# Patient Record
Sex: Female | Born: 1965
Health system: Southern US, Community
[De-identification: ages and names within clinical notes are randomized; demographics above are authoritative.]

## PROBLEM LIST (undated history)

## (undated) DIAGNOSIS — E7800 Pure hypercholesterolemia, unspecified: Secondary | ICD-10-CM

## (undated) DIAGNOSIS — E119 Type 2 diabetes mellitus without complications: Secondary | ICD-10-CM

## (undated) DIAGNOSIS — G43909 Migraine, unspecified, not intractable, without status migrainosus: Secondary | ICD-10-CM

## (undated) DIAGNOSIS — M62838 Other muscle spasm: Secondary | ICD-10-CM

## (undated) HISTORY — PX: MYOMECTOMY: SHX85

## (undated) HISTORY — PX: ABDOMINAL HYSTERECTOMY: SHX81

## (undated) HISTORY — PX: UTERINE FIBROID SURGERY: SHX826

## (undated) HISTORY — DX: Other muscle spasm: M62.838

## (undated) HISTORY — DX: Migraine, unspecified, not intractable, without status migrainosus: G43.909

---

## 2015-08-30 DIAGNOSIS — R0602 Shortness of breath: Secondary | ICD-10-CM | POA: Diagnosis not present

## 2015-08-30 DIAGNOSIS — R079 Chest pain, unspecified: Secondary | ICD-10-CM | POA: Diagnosis not present

## 2015-08-30 DIAGNOSIS — R0789 Other chest pain: Secondary | ICD-10-CM | POA: Diagnosis not present

## 2015-08-30 DIAGNOSIS — R42 Dizziness and giddiness: Secondary | ICD-10-CM | POA: Diagnosis not present

## 2015-08-31 DIAGNOSIS — R079 Chest pain, unspecified: Secondary | ICD-10-CM | POA: Diagnosis not present

## 2015-09-04 ENCOUNTER — Emergency Department (HOSPITAL_COMMUNITY)
Admission: EM | Admit: 2015-09-04 | Discharge: 2015-09-04 | Disposition: A | Discharge status: Home / Self Care | Payer: BLUE CROSS/BLUE SHIELD | Attending: Emergency Medicine | Admitting: Emergency Medicine

## 2015-09-04 ENCOUNTER — Encounter (HOSPITAL_COMMUNITY): Payer: Self-pay | Admitting: Emergency Medicine

## 2015-09-04 DIAGNOSIS — M62838 Other muscle spasm: Secondary | ICD-10-CM | POA: Diagnosis not present

## 2015-09-04 DIAGNOSIS — M542 Cervicalgia: Secondary | ICD-10-CM | POA: Diagnosis present

## 2015-09-04 DIAGNOSIS — E119 Type 2 diabetes mellitus without complications: Secondary | ICD-10-CM | POA: Insufficient documentation

## 2015-09-04 HISTORY — DX: Pure hypercholesterolemia, unspecified: E78.00

## 2015-09-04 HISTORY — DX: Type 2 diabetes mellitus without complications: E11.9

## 2015-09-04 MED ORDER — DIAZEPAM 2 MG PO TABS: 2.0000 mg | ORAL_TABLET | Freq: Once | ORAL | Status: DC

## 2015-09-04 MED ORDER — CYCLOBENZAPRINE HCL 10 MG PO TABS: 10.0000 mg | ORAL_TABLET | Freq: Two times a day (BID) | ORAL | 0 refills | Status: DC | PRN

## 2015-09-04 MED ORDER — DIAZEPAM 5 MG PO TABS
5.0000 mg | ORAL_TABLET | Freq: Once | ORAL | Status: AC
  Administered 2015-09-04: 5 mg via ORAL
  Filled 2015-09-04: qty 1

## 2015-09-04 MED ORDER — DIAZEPAM 5 MG PO TABS: 5.0000 mg | ORAL_TABLET | Freq: Two times a day (BID) | ORAL | 0 refills | Status: DC

## 2015-09-04 NOTE — ED Provider Notes (Signed)
[facility]-EMERGENCY DEPT Provider Note   CSN: [id number] Arrival date & time: 09/04/15  1236  By signing my name below, I, [name], attest that this documentation has been prepared under the direction and in the presence of PA. Electronically signed by: [name], ED Scribe. 09/04/15. 1:31 PM.   History   Chief Complaint Chief Complaint  Patient presents with  . Neck Pain  . Back Pain     The history is provided by the patient. No language interpreter was used.   HPI Comments:  Anna [name] is a 50 y.o. female who presents to the Emergency Department complaining of muscle spasms, which started 7 AM this morning. Spasms localized to right neck, shoulder and back. Symptoms came on suddenly. Associated symptoms include weakness and intermittent numbness in right arm. Pt has taken Tylenol, with no relief. Denies chest pain, SOB, numbness, weakness or tingling in lower extremities or left arm. No recent injuries.   Pe:   Past Medical History:  Diagnosis Date  . Diabetes mellitus without complication (HCC)   . Hypercholesteremia     There are no active problems to display for this patient.   History reviewed. No pertinent surgical history.  OB History    No data available       Home Medications    Prior to Admission medications   Medication Sig Start Date End Date Taking? Authorizing Provider  cyclobenzaprine (FLEXERIL) 10 MG tablet Take 1 tablet (10 mg total) by mouth 2 (two) times daily as needed for muscle spasms. 09/04/15   Eyvonne MechanicJeffrey Jeorge Reister, PA-C  diazepam (VALIUM) 5 MG tablet Take 1 tablet (5 mg total) by mouth 2 (two) times daily. 09/04/15   Eyvonne MechanicJeffrey Calee Nugent, PA-C    Family History History reviewed. No pertinent family history.  Social History Social History  Substance Use Topics  . Smoking status: Never Smoker  . Smokeless tobacco: Never Used  . Alcohol use No     Allergies   Gadolinium derivatives   Review of Systems Review of Systems  Respiratory:  Negative for shortness of breath.   Cardiovascular: Negative for chest pain.  Musculoskeletal: Positive for arthralgias, myalgias and neck pain.  Neurological: Positive for numbness. Negative for weakness.       Weakness and intermittent numbness in right arm due to tensing and tightening of muscles.  No weakness, numbness or tingling in left arm or lower extremities.  All other systems reviewed and are negative.    Physical Exam Updated Vital Signs BP 136/88   Pulse 75   Temp 98.2 F (36.8 C) (Oral)   Resp 16   SpO2 99%   Physical Exam  Constitutional: She is oriented to person, place, and time. She appears well-developed and well-nourished. No distress.  HENT:  Head: Normocephalic and atraumatic.  Eyes: Conjunctivae are normal. Right eye exhibits no discharge. Left eye exhibits no discharge. No scleral icterus.  Neck: Normal range of motion. Neck supple.  Cardiovascular: Normal rate.   Pulmonary/Chest: Effort normal.  Musculoskeletal: Normal range of motion. She exhibits tenderness. She exhibits no edema.  Obvious muscle spasm to right posterior neck musculature and upper trapezius. Full active ROM of right upper extremity. Sensations intact, grip strength 5/5. Radial pulse 2+.  No C, T, or L spine tenderness to palpation. No obvious signs of trauma, deformity, infection, step-offs. Lung expansion normal. No scoliosis or kyphosis. Bilateral lower extremity strength 5 out of 5, sensation grossly intact.   Ambulates without difficulty.   Neurological: She is alert and  oriented to person, place, and time. Coordination normal.  Skin: Skin is warm and dry. No rash noted. She is not diaphoretic. No erythema. No pallor.  Psychiatric: She has a normal mood and affect. Her behavior is normal. Judgment and thought content normal.  Nursing note and vitals reviewed.    ED Treatments / Results  DIAGNOSTIC STUDIES:  Oxygen Saturation is 99% on room air, normal by my interpretation.      COORDINATION OF CARE:  1:19 PM Discussed treatment plan with pt at bedside, which includes dosage of Valium in ED and prescribed Valium and Flexeril, and pt agreed to plan. Advised to apply heat and massage affected area. Follow up with PCP on Monday if sxs do not worsen.   Labs (all labs ordered are listed, but only abnormal results are displayed) Labs Reviewed - No data to display  EKG  EKG Interpretation None       Radiology No results found.  Procedures Procedures (including critical care time)  Medications Ordered in ED Medications  diazepam (VALIUM) tablet 5 mg (not administered)     Initial Impression / Assessment and Plan / ED Course  I have reviewed the triage vital signs and the nursing notes.  Pertinent labs & imaging results that were available during my care of the patient were reviewed by me and considered in my medical decision making (see chart for details).  Clinical Course    Labs:   Imaging:   Consults:   Therapeutics: Valium  Discharge Meds: Rxed Valium and Flexeril  Assessment/Plan:   50 year old female presents today with muscle spasm in her neck. Patient has no neurological deficits,he should chest pain, shortness of breath, or anterior face neck pain. Patient will be instructed to use ibuprofen or Tylenol, Valium if it improves symptoms, Flexeril if it does not. She is encouraged follow up with her primary care provider in 3-4 days for reevaluation, return to emergency room immediately if any new or worsening signs or symptoms present. Patient verbalized understanding and agreement to today's plan had no further questions or concerns at the time discharge.   Final Clinical Impressions(s) / ED Diagnoses   Final diagnoses:  Muscle spasm    New Prescriptions New Prescriptions   CYCLOBENZAPRINE (FLEXERIL) 10 MG TABLET    Take 1 tablet (10 mg total) by mouth 2 (two) times daily as needed for muscle spasms.   DIAZEPAM (VALIUM) 5 MG  TABLET    Take 1 tablet (5 mg total) by mouth 2 (two) times daily.  I personally performed the services described in this documentation, which was scribed in my presence. The recorded information has been reviewed and is accurate.    Eyvonne MechanicJeffrey Rosela Supak, PA-C 09/04/15 1337    Raeford RazorStephen Kohut, MD 09/07/15 [phone]

## 2015-09-04 NOTE — Discharge Instructions (Signed)
Please use medication as directed. If Valium does not provide significant improvement in symptoms, please use Flexeril. Please do not drink drive or operate any machinery while using prescribed medication. If your symptoms persist please follow-up with her primary care provider in 3 to 4 days for reevaluation. If your symptoms worsen please return to the emergency room immediately for further evaluation and management.

## 2015-09-04 NOTE — ED Triage Notes (Signed)
Pt reports R neck, back and shoulder spasms since yesterday evening. No known injuries.

## 2015-09-04 NOTE — ED Notes (Signed)
PA at bedside.

## 2015-12-18 ENCOUNTER — Observation Stay (HOSPITAL_COMMUNITY)
Admission: EM | Admit: 2015-12-18 | Discharge: 2015-12-20 | Disposition: A | Discharge status: Home / Self Care | Payer: BLUE CROSS/BLUE SHIELD | Attending: Internal Medicine | Admitting: Internal Medicine

## 2015-12-18 ENCOUNTER — Emergency Department (HOSPITAL_COMMUNITY): Payer: BLUE CROSS/BLUE SHIELD

## 2015-12-18 ENCOUNTER — Encounter (HOSPITAL_COMMUNITY): Payer: Self-pay | Admitting: Emergency Medicine

## 2015-12-18 DIAGNOSIS — E785 Hyperlipidemia, unspecified: Secondary | ICD-10-CM | POA: Diagnosis not present

## 2015-12-18 DIAGNOSIS — I200 Unstable angina: Secondary | ICD-10-CM | POA: Diagnosis not present

## 2015-12-18 DIAGNOSIS — R079 Chest pain, unspecified: Secondary | ICD-10-CM | POA: Diagnosis present

## 2015-12-18 DIAGNOSIS — Z79899 Other long term (current) drug therapy: Secondary | ICD-10-CM | POA: Diagnosis not present

## 2015-12-18 DIAGNOSIS — I10 Essential (primary) hypertension: Secondary | ICD-10-CM | POA: Diagnosis not present

## 2015-12-18 DIAGNOSIS — R0789 Other chest pain: Secondary | ICD-10-CM | POA: Diagnosis not present

## 2015-12-18 DIAGNOSIS — Z7982 Long term (current) use of aspirin: Secondary | ICD-10-CM | POA: Diagnosis not present

## 2015-12-18 DIAGNOSIS — Z9114 Patient's other noncompliance with medication regimen: Secondary | ICD-10-CM | POA: Diagnosis not present

## 2015-12-18 DIAGNOSIS — E119 Type 2 diabetes mellitus without complications: Secondary | ICD-10-CM

## 2015-12-18 DIAGNOSIS — E7800 Pure hypercholesterolemia, unspecified: Secondary | ICD-10-CM | POA: Diagnosis not present

## 2015-12-18 LAB — BASIC METABOLIC PANEL
Anion gap: 7 (ref 5–15)
BUN: 19 mg/dL (ref 6–20)
CHLORIDE: 105 mmol/L (ref 101–111)
CO2: 28 mmol/L (ref 22–32)
CREATININE: 0.87 mg/dL (ref 0.44–1.00)
Calcium: 8.8 mg/dL — ABNORMAL LOW (ref 8.9–10.3)
GFR calc non Af Amer: >60 mL/min (ref >60)
Glucose, Bld: 101 mg/dL — ABNORMAL HIGH (ref 65–99)
Potassium: 3.5 mmol/L (ref 3.5–5.1)
Sodium: 140 mmol/L (ref 135–145)

## 2015-12-18 LAB — CBC
HCT: 36.3 % (ref 36.0–46.0)
Hemoglobin: 11.8 g/dL — ABNORMAL LOW (ref 12.0–15.0)
MCH: 28.4 pg (ref 26.0–34.0)
MCHC: 32.5 g/dL (ref 30.0–36.0)
MCV: 87.5 fL (ref 78.0–100.0)
PLATELETS: 270 10*3/uL (ref 150–400)
RBC: 4.15 MIL/uL (ref 3.87–5.11)
RDW: 13.7 % (ref 11.5–15.5)
WBC: 7.7 10*3/uL (ref 4.0–10.5)

## 2015-12-18 MED ORDER — MORPHINE SULFATE (PF) 4 MG/ML IV SOLN
4.0000 mg | Freq: Once | INTRAVENOUS | Status: AC
  Administered 2015-12-18: 4 mg via INTRAVENOUS
  Filled 2015-12-18: qty 1

## 2015-12-18 MED ORDER — ONDANSETRON HCL 4 MG/2ML IJ SOLN
4.0000 mg | Freq: Once | INTRAMUSCULAR | Status: AC
  Administered 2015-12-18: 4 mg via INTRAVENOUS
  Filled 2015-12-18: qty 2

## 2015-12-18 NOTE — ED Provider Notes (Signed)
[facility]-EMER[facility]ENCY DEPT Provider Note   CSN: [id number] Arrival date & time: 12/18/15  2143 By signing my name below, I, Levon HedgerElizabeth Hall, attest that this documentation has been prepared under the direction and in the presence of non-physician practitioner, Harolyn RutherfordShawn Joy, PA-C. Electronically Signed: Levon HedgerElizabeth Hall, Scribe. 12/18/2015. 11:46 PM.   History   Chief Complaint Chief Complaint  Patient presents with  . Chest Pain   HPI Anna [name] is a 50 y.o. female with hx of DM who presents to the Emergency Department complaining of intermittent, worsening, moderate to severe, retrosternal chest pain that radiates to the left chest which began last night. She describes the pain as pressure-like. Chest pain is exacerbated by going up and down the stairs. Pt notes associated SOB with exertion. Patient states that she has not had this type of pain in the past. She endorses family cardiac hx; pt's mother died of MI at 9455 and her father died of MI at 1269. Patient denies cough, current shortness of breath, diaphoresis, dizziness, nausea/vomiting, or any other similar complaints.  Patient has a secondary complaint of constant left sided neck pain radiating down the left arm accompanied by tingling. Symptoms are exacerbated by moving her head from side to side and with rotation of the left shoulder. She has had similar pain before on the right side. Denies weakness or trauma.      The history is provided by the patient. No language interpreter was used.   Past Medical History:  Diagnosis Date  . Diabetes mellitus without complication (HCC)   . Hypercholesteremia     Patient Active Problem List   Diagnosis Date Noted  . Chest pain 12/19/2015    History reviewed. No pertinent surgical history.  OB History    No data available      Home Medications    Prior to Admission medications   Medication Sig Start Date End Date Taking? Authorizing Provider  aspirin 325 MG tablet Take 325 mg by mouth  every 4 (four) hours as needed for mild pain.   Yes Historical Provider, MD  ibuprofen (ADVIL,MOTRIN) 200 MG tablet Take 400 mg by mouth every 6 (six) hours as needed for moderate pain.   Yes Historical Provider, MD  cyclobenzaprine (FLEXERIL) 10 MG tablet Take 1 tablet (10 mg total) by mouth 2 (two) times daily as needed for muscle spasms. Patient not taking: Reported on 12/19/2015 09/04/15   Eyvonne MechanicJeffrey Hedges, PA-C  diazepam (VALIUM) 5 MG tablet Take 1 tablet (5 mg total) by mouth 2 (two) times daily. Patient not taking: Reported on 12/19/2015 09/04/15   Eyvonne MechanicJeffrey Hedges, PA-C    Family History History reviewed. No pertinent family history.  Social History Social History  Substance Use Topics  . Smoking status: Never Smoker  . Smokeless tobacco: Never Used  . Alcohol use No    Allergies   Gadolinium derivatives  Review of Systems Review of Systems  Constitutional: Negative for chills and fever.  Respiratory: Positive for shortness of breath (exertional). Negative for cough.   Cardiovascular: Positive for chest pain.  Gastrointestinal: Negative for nausea and vomiting.  Musculoskeletal: Positive for myalgias and neck pain.  Neurological: Negative for weakness.  All other systems reviewed and are negative.  Physical Exam Updated Vital Signs BP 131/86 (BP Location: Left Arm)   Pulse 93   Temp 97.9 F (36.6 C) (Oral)   Resp 14   Ht 5\' 6"  (1.676 m)   Wt 189 lb (85.7 kg)   SpO2 98%   BMI  30.51 kg/m   Physical Exam  Constitutional: She appears well-developed and well-nourished. No distress.  HENT:  Head: Normocephalic and atraumatic.  Eyes: Conjunctivae and EOM are normal. Pupils are equal, round, and reactive to light.  Neck: Neck supple.  Cardiovascular: Normal rate, regular rhythm, normal heart sounds and intact distal pulses.   Pulmonary/Chest: Effort normal and breath sounds normal. No respiratory distress. She exhibits no tenderness.  Abdominal: Soft. There is no  tenderness. There is no guarding.  Musculoskeletal: She exhibits tenderness. She exhibits no edema.  Tenderness to the left cervical musculature and left trapezius. Lateral movement of the cervical spine performed with ear to shoulder elicits increased pain and tingling extending down the left arm.   Lymphadenopathy:    She has no cervical adenopathy.  Neurological: She is alert.  No sensory deficits. Strength 5/5 in all extremities. Coordination intact. Cranial nerves III-XII grossly intact. No facial droop.   Skin: Skin is warm and dry. She is not diaphoretic.  Psychiatric: She has a normal mood and affect. Her behavior is normal.  Nursing note and vitals reviewed.  ED Treatments / Results  DIAGNOSTIC STUDIES:  Oxygen Saturation is 98% on RA, normal by my interpretation.    COORDINATION OF CARE:  11:40 PM Will order Zofran and morphine. Discussed treatment plan with pt at bedside and pt agreed to plan.  Labs (all labs ordered are listed, but only abnormal results are displayed) Labs Reviewed  BASIC METABOLIC PANEL - Abnormal; Notable for the following:       Result Value   Glucose, Bld 101 (*)    Calcium 8.8 (*)    All other components within normal limits  CBC - Abnormal; Notable for the following:    Hemoglobin 11.8 (*)    All other components within normal limits  TROPONIN I  I-STAT TROPOININ, ED    EKG  EKG Interpretation  Date/Time:  Thursday December 18 2015 21:52:27 EST Ventricular Rate:  89 PR Interval:    QRS Duration: 82 QT Interval:  372 QTC Calculation: 453 R Axis:   77 Text Interpretation:  Sinus rhythm Normal ECG Confirmed by DELO  MD, DOUGLAS ([id number]) on 12/18/2015 11:52:00 PM       Radiology Dg Chest 2 View  Result Date: 12/18/2015 CLINICAL DATA:  Upper left chest pain EXAM: CHEST  2 VIEW COMPARISON:  None. FINDINGS: The heart size and mediastinal contours are within normal limits. Both lungs are clear. The visualized skeletal structures are  unremarkable. IMPRESSION: No active cardiopulmonary disease. Electronically Signed   By: Jasmine Pang M.D.   On: 12/18/2015 23:47    Procedures Procedures (including critical care time)  Medications Ordered in ED Medications  ondansetron (ZOFRAN) injection 4 mg (4 mg Intravenous Given 12/18/15 2350)  morphine 4 MG/ML injection 4 mg (4 mg Intravenous Given 12/18/15 2350)  aspirin chewable tablet 324 mg (162 mg Oral Given 12/19/15 0026)     Initial Impression / Assessment and Plan / ED Course  I have reviewed the triage vital signs and the nursing notes.  Pertinent labs & imaging results that were available during my care of the patient were reviewed by me and considered in my medical decision making (see chart for details).  Clinical Course     Patient presents with chest pain as well as left sided neck pain with tingling. This patient's presentation with me to believe that the patient's complaints may be separate. The patient's chest pain complaint is suspicious for possible ACS due to  description, medical history, and family history. Suspect the patient's pain in the left side of the neck extending into the arm is radicular in nature. Spoke with Dr. Ophelia CharterYates, hospitalist, who agreed to admit the patient to telemetry observation.  [facility] with no abnormalities. This workup was done because the patient was having right-sided chest pain and pain over the carotid artery at that time.   Findings and plan of care discussed with Geoffery Lyonsouglas Delo, MD.    Final Clinical Impressions(s) / ED Diagnoses   Final diagnoses:  Chest pain, unspecified type    New Prescriptions New Prescriptions   No medications on file   I personally performed the services described in this documentation, which was scribed in my presence. The recorded information has been reviewed and is accurate.   Anselm PancoastShawn C Joy, PA-C 12/19/15 [id number]    Geoffery Lyonsouglas Delo, MD 12/22/15 [phone]

## 2015-12-18 NOTE — ED Triage Notes (Signed)
Pt reports having left chest pain that started yesterday evening which radiates down left arm with accompanying numbness and tingling left arm and hand that was worse tonight.

## 2015-12-19 ENCOUNTER — Encounter (HOSPITAL_COMMUNITY): Payer: Self-pay | Admitting: Internal Medicine

## 2015-12-19 ENCOUNTER — Encounter (HOSPITAL_COMMUNITY)
Admission: EM | Disposition: A | Discharge status: Home / Self Care | Payer: Self-pay | Source: Home / Self Care | Attending: Emergency Medicine

## 2015-12-19 DIAGNOSIS — I10 Essential (primary) hypertension: Secondary | ICD-10-CM | POA: Diagnosis not present

## 2015-12-19 DIAGNOSIS — I200 Unstable angina: Secondary | ICD-10-CM | POA: Diagnosis present

## 2015-12-19 DIAGNOSIS — E119 Type 2 diabetes mellitus without complications: Secondary | ICD-10-CM | POA: Diagnosis not present

## 2015-12-19 DIAGNOSIS — E7800 Pure hypercholesterolemia, unspecified: Secondary | ICD-10-CM | POA: Diagnosis not present

## 2015-12-19 DIAGNOSIS — E785 Hyperlipidemia, unspecified: Secondary | ICD-10-CM | POA: Diagnosis present

## 2015-12-19 DIAGNOSIS — R079 Chest pain, unspecified: Secondary | ICD-10-CM | POA: Diagnosis present

## 2015-12-19 HISTORY — PX: CARDIAC CATHETERIZATION: SHX172

## 2015-12-19 LAB — GLUCOSE, CAPILLARY
Glucose-Capillary: 103 mg/dL — ABNORMAL HIGH (ref 65–99)
Glucose-Capillary: 90 mg/dL (ref 65–99)

## 2015-12-19 LAB — CBC
HCT: 35.6 % — ABNORMAL LOW (ref 36.0–46.0)
Hemoglobin: 11.5 g/dL — ABNORMAL LOW (ref 12.0–15.0)
MCH: 27.8 pg (ref 26.0–34.0)
MCHC: 32.3 g/dL (ref 30.0–36.0)
MCV: 86 fL (ref 78.0–100.0)
PLATELETS: 272 10*3/uL (ref 150–400)
RBC: 4.14 MIL/uL (ref 3.87–5.11)
RDW: 13.6 % (ref 11.5–15.5)
WBC: 7.2 10*3/uL (ref 4.0–10.5)

## 2015-12-19 LAB — PROTIME-INR
INR: 0.96
Prothrombin Time: 12.8 seconds (ref 11.4–15.2)

## 2015-12-19 LAB — TROPONIN I: Troponin I: <0.03 ng/mL (ref <0.03)

## 2015-12-19 LAB — BASIC METABOLIC PANEL
Anion gap: 9 (ref 5–15)
BUN: 20 mg/dL (ref 6–20)
CHLORIDE: 102 mmol/L (ref 101–111)
CO2: 27 mmol/L (ref 22–32)
CREATININE: 0.86 mg/dL (ref 0.44–1.00)
Calcium: 8.7 mg/dL — ABNORMAL LOW (ref 8.9–10.3)
GFR calc Af Amer: >60 mL/min (ref >60)
GLUCOSE: 118 mg/dL — AB (ref 65–99)
POTASSIUM: 3.6 mmol/L (ref 3.5–5.1)
Sodium: 138 mmol/L (ref 135–145)

## 2015-12-19 LAB — LIPID PANEL
CHOL/HDL RATIO: 3.9 ratio
Cholesterol: 213 mg/dL — ABNORMAL HIGH (ref 0–200)
HDL: 54 mg/dL (ref >40)
LDL Cholesterol: 125 mg/dL — ABNORMAL HIGH (ref 0–99)
Triglycerides: 172 mg/dL — ABNORMAL HIGH (ref <150)
VLDL: 34 mg/dL (ref 0–40)

## 2015-12-19 LAB — I-STAT TROPONIN, ED: Troponin i, poc: 0 ng/mL (ref 0.00–0.08)

## 2015-12-19 LAB — HEPARIN LEVEL (UNFRACTIONATED): HEPARIN UNFRACTIONATED: 0.34 [IU]/mL (ref 0.30–0.70)

## 2015-12-19 LAB — TSH: TSH: 2.234 u[IU]/mL (ref 0.350–4.500)

## 2015-12-19 LAB — APTT: APTT: 27 s (ref 24–36)

## 2015-12-19 SURGERY — LEFT HEART CATH AND CORONARY ANGIOGRAPHY
Anesthesia: LOCAL

## 2015-12-19 MED ORDER — HEPARIN (PORCINE) IN NACL 100-0.45 UNIT/ML-% IJ SOLN
850.0000 [IU]/h | INTRAMUSCULAR | Status: DC
  Administered 2015-12-19: 850 [IU]/h via INTRAVENOUS
  Filled 2015-12-19: qty 250

## 2015-12-19 MED ORDER — FENTANYL CITRATE (PF) 100 MCG/2ML IJ SOLN
INTRAMUSCULAR | Status: AC
  Filled 2015-12-19: qty 2

## 2015-12-19 MED ORDER — HEPARIN SODIUM (PORCINE) 1000 UNIT/ML IJ SOLN
INTRAMUSCULAR | Status: AC
  Filled 2015-12-19: qty 1

## 2015-12-19 MED ORDER — HEPARIN (PORCINE) IN NACL 2-0.9 UNIT/ML-% IJ SOLN
INTRAMUSCULAR | Status: AC
  Filled 2015-12-19: qty 1000

## 2015-12-19 MED ORDER — SODIUM CHLORIDE 0.9% FLUSH: 3.0000 mL | Freq: Two times a day (BID) | INTRAVENOUS | Status: DC

## 2015-12-19 MED ORDER — ASPIRIN 81 MG PO CHEW
324.0000 mg | CHEWABLE_TABLET | Freq: Once | ORAL | Status: AC
  Administered 2015-12-19: 162 mg via ORAL
  Filled 2015-12-19: qty 4

## 2015-12-19 MED ORDER — ATORVASTATIN CALCIUM 20 MG PO TABS
20.0000 mg | ORAL_TABLET | Freq: Every day | ORAL | Status: DC
  Administered 2015-12-19: 20 mg via ORAL
  Filled 2015-12-19: qty 1

## 2015-12-19 MED ORDER — ASPIRIN 81 MG PO CHEW: 81.0000 mg | CHEWABLE_TABLET | ORAL | Status: DC

## 2015-12-19 MED ORDER — ONDANSETRON HCL 4 MG/2ML IJ SOLN: 4.0000 mg | Freq: Four times a day (QID) | INTRAMUSCULAR | Status: DC | PRN

## 2015-12-19 MED ORDER — SODIUM CHLORIDE 0.9 % IV SOLN: 250.0000 mL | INTRAVENOUS | Status: DC | PRN

## 2015-12-19 MED ORDER — ONDANSETRON HCL 4 MG/2ML IJ SOLN: 4.0000 mg | Freq: Three times a day (TID) | INTRAMUSCULAR | Status: DC | PRN

## 2015-12-19 MED ORDER — SODIUM CHLORIDE 0.9% FLUSH: 3.0000 mL | INTRAVENOUS | Status: DC | PRN

## 2015-12-19 MED ORDER — HEPARIN (PORCINE) IN NACL 2-0.9 UNIT/ML-% IJ SOLN
INTRAMUSCULAR | Status: DC | PRN
  Administered 2015-12-19: 1000 mL

## 2015-12-19 MED ORDER — ACETAMINOPHEN 325 MG PO TABS
650.0000 mg | ORAL_TABLET | ORAL | Status: DC | PRN
  Administered 2015-12-19 – 2015-12-20 (×2): 650 mg via ORAL

## 2015-12-19 MED ORDER — ACETAMINOPHEN 325 MG PO TABS
650.0000 mg | ORAL_TABLET | ORAL | Status: DC | PRN
  Administered 2015-12-19: 650 mg via ORAL
  Filled 2015-12-19 (×2): qty 2

## 2015-12-19 MED ORDER — ACETAMINOPHEN 325 MG PO TABS
ORAL_TABLET | ORAL | Status: AC
  Filled 2015-12-19: qty 2

## 2015-12-19 MED ORDER — VERAPAMIL HCL 2.5 MG/ML IV SOLN
INTRAVENOUS | Status: AC
  Filled 2015-12-19: qty 2

## 2015-12-19 MED ORDER — MIDAZOLAM HCL 2 MG/2ML IJ SOLN
INTRAMUSCULAR | Status: DC | PRN
  Administered 2015-12-19: 2 mg via INTRAVENOUS

## 2015-12-19 MED ORDER — INSULIN ASPART 100 UNIT/ML ~~LOC~~ SOLN
0.0000 [IU] | Freq: Three times a day (TID) | SUBCUTANEOUS | Status: DC
Start: 2015-12-19 — End: 2015-12-20

## 2015-12-19 MED ORDER — SODIUM CHLORIDE 0.9 % IV SOLN: INTRAVENOUS | Status: DC

## 2015-12-19 MED ORDER — LIDOCAINE HCL (PF) 1 % IJ SOLN
INTRAMUSCULAR | Status: AC
  Filled 2015-12-19: qty 30

## 2015-12-19 MED ORDER — MIDAZOLAM HCL 2 MG/2ML IJ SOLN
INTRAMUSCULAR | Status: AC
  Filled 2015-12-19: qty 2

## 2015-12-19 MED ORDER — VERAPAMIL HCL 2.5 MG/ML IV SOLN
INTRAVENOUS | Status: DC | PRN
  Administered 2015-12-19: 10 mL via INTRA_ARTERIAL

## 2015-12-19 MED ORDER — METOPROLOL TARTRATE 25 MG PO TABS
12.5000 mg | ORAL_TABLET | Freq: Two times a day (BID) | ORAL | Status: DC
  Administered 2015-12-19 – 2015-12-20 (×4): 12.5 mg via ORAL
  Filled 2015-12-19 (×4): qty 1

## 2015-12-19 MED ORDER — SODIUM CHLORIDE 0.9 % IV SOLN: INTRAVENOUS | Status: AC

## 2015-12-19 MED ORDER — IOPAMIDOL (ISOVUE-370) INJECTION 76%
INTRAVENOUS | Status: DC | PRN
  Administered 2015-12-19: 40 mL via INTRA_ARTERIAL

## 2015-12-19 MED ORDER — SODIUM CHLORIDE 0.9% FLUSH
3.0000 mL | Freq: Two times a day (BID) | INTRAVENOUS | Status: DC
  Administered 2015-12-20: 3 mL via INTRAVENOUS

## 2015-12-19 MED ORDER — ALPRAZOLAM 1 MG PO TABS: 1.0000 mg | ORAL_TABLET | Freq: Two times a day (BID) | ORAL | Status: DC | PRN

## 2015-12-19 MED ORDER — IOPAMIDOL (ISOVUE-370) INJECTION 76%
INTRAVENOUS | Status: AC
  Filled 2015-12-19: qty 100

## 2015-12-19 MED ORDER — HEPARIN BOLUS VIA INFUSION
4000.0000 [IU] | Freq: Once | INTRAVENOUS | Status: AC
  Administered 2015-12-19: 4000 [IU] via INTRAVENOUS
  Filled 2015-12-19: qty 4000

## 2015-12-19 MED ORDER — ASPIRIN EC 81 MG PO TBEC
81.0000 mg | DELAYED_RELEASE_TABLET | Freq: Every day | ORAL | Status: DC
  Administered 2015-12-20: 81 mg via ORAL
  Filled 2015-12-19: qty 1

## 2015-12-19 MED ORDER — NITROGLYCERIN 0.4 MG SL SUBL: 0.4000 mg | SUBLINGUAL_TABLET | SUBLINGUAL | Status: DC | PRN

## 2015-12-19 MED ORDER — SODIUM CHLORIDE 0.9 % IV SOLN
250.0000 mL | INTRAVENOUS | Status: DC | PRN
Start: 2015-12-19 — End: 2015-12-19

## 2015-12-19 MED ORDER — FENTANYL CITRATE (PF) 100 MCG/2ML IJ SOLN
INTRAMUSCULAR | Status: DC | PRN
  Administered 2015-12-19: 50 ug via INTRAVENOUS

## 2015-12-19 MED ORDER — LIDOCAINE HCL (PF) 1 % IJ SOLN
INTRAMUSCULAR | Status: DC | PRN
  Administered 2015-12-19: 2 mL via INTRADERMAL

## 2015-12-19 MED ORDER — HEPARIN SODIUM (PORCINE) 1000 UNIT/ML IJ SOLN
INTRAMUSCULAR | Status: DC | PRN
  Administered 2015-12-19: 4500 [IU] via INTRAVENOUS

## 2015-12-19 SURGICAL SUPPLY — 11 items
CATH INFINITI 5 FR STR PIGTAIL (CATHETERS) ×2 IMPLANT
CATH OPTITORQUE TIG 4.0 5F (CATHETERS) ×2 IMPLANT
DEVICE RAD COMP TR BAND LRG (VASCULAR PRODUCTS) ×2 IMPLANT
GLIDESHEATH SLEND SS 6F .021 (SHEATH) ×2 IMPLANT
GUIDEWIRE INQWIRE 1.5J.035X260 (WIRE) ×2 IMPLANT
INQWIRE 1.5J .035X260CM (WIRE) ×4
KIT HEART LEFT (KITS) ×2 IMPLANT
PACK CARDIAC CATHETERIZATION (CUSTOM PROCEDURE TRAY) ×2 IMPLANT
SYR MEDRAD MARK V 150ML (SYRINGE) ×2 IMPLANT
TRANSDUCER W/STOPCOCK (MISCELLANEOUS) ×2 IMPLANT
TUBING CIL FLEX 10 FLL-RA (TUBING) ×2 IMPLANT

## 2015-12-19 NOTE — H&P (View-Only) (Signed)
Cardiology Consult    Patient ID: Anna [name] MRN: [id number], DOB/AGE: 1965/04/20   Admit date: 12/18/2015 Date of Consult: 12/19/2015  Primary Physician: No primary care provider on file. Reason for Consult: Chest Pain Primary Cardiologist: Seen at [facility] in 08/2015  Requesting Provider: Dr. Ophelia CharterYates   History of Present Illness    Anna [name] is a 50 y.o. female with past medical history of Type 2 DM, HTN, HLD, migraine headaches, and family history of CAD who [facility]/2015 for evaluation of chest pain and associated left arm numbness.   She was seen at [facility] in 08/2015 for evaluation of chest pain ([place] in [place] works in [place], [place] Cardiac enzymes remained negative. A stress echocardiogram was performed and showed no wall motion abnormalities with a normal EF of 55%. Was able to achieve 10.10 METS of activity without anginal symptoms.   Since then, she reports have a left arm numbness and tingling for the past month which is being followed by her PCP. However, starting yesterday, she developed a sternal chest pressure which occurred when climbing stairs in her home and carrying laundry. She experienced associated dyspnea. Denies any nausea, vomiting, or diaphoresis. She would lay down to rest and the pain would improve but then represent when up walking around.   She reports a mild pressure at this time but says it is not nearly as intense as yesterday. Breathing at baseline.   She denies any personal cardiac history. No history of tobacco use, alcohol use, or recreational drug use. Has a significant family history of CAD with mother having a fatal MI at age 51 and father having a fatal MI at age 51.  While admitted, labs have shown a WBC of 7.7, Hgb 11.8, and platelets 270. Na+ 140. K+ 3.5. Creatinine 0.87. Initial two troponin values have been negative. Hgb A1c and Lipid Panel are pending. CXR shows no active cardiopulmonary disease.   EKG shows NSR, HR 89, with no acute ST or T-wave changes.   Past Medical History   Past Medical History:  Diagnosis Date  . Diabetes mellitus without complication (HCC)   . Hypercholesteremia     Past Surgical History:  Procedure Laterality Date  . ABDOMINAL HYSTERECTOMY    . MYOMECTOMY     x2     Allergies  Allergies  Allergen Reactions  . Gadolinium Derivatives     hives    Inpatient Medications    . [START ON 12/20/2015] aspirin EC  81 mg Oral Daily  . atorvastatin  20 mg Oral q1800  . insulin aspart  0-15 Units Subcutaneous TID WC  . metoprolol tartrate  12.5 mg Oral BID    Family History    Family History  Problem Relation Age of Onset  . CAD Mother 5255  . CAD Father [id number]    Social History    Social History   Social History  . Marital status: Single    Spouse name: N/A  . Number of children: N/A  . 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 Smoking status: Never Smoker  . Smokeless tobacco: Never Used  . Alcohol use Yes     Comment: rare  . Drug use: No  . Sexual activity: Not on file   Other Topics Concern  . Not on file   Social History Narrative  . No narrative on file  Review of Systems    General:  No chills, fever, night sweats or weight changes.  Cardiovascular:  No edema, orthopnea, palpitations, paroxysmal nocturnal dyspnea. Positive for chest pain and dyspnea on exertion.  Dermatological: No rash, lesions/masses Respiratory: No cough, dyspnea Urologic: No hematuria, dysuria Abdominal:   No nausea, vomiting, diarrhea, bright red blood per rectum, melena, or hematemesis Neurologic:  No visual changes, wkns, changes in mental status. All other systems reviewed and are otherwise negative except as noted above.  Physical Exam    Blood pressure 132/86, pulse 66, temperature 98.4 F (36.9 C), temperature source Oral, resp. rate 16, height 5\' 6"  (1.676 m), weight 197  lb 1.6 oz (89.4 kg), SpO2 100 %.  General: Pleasant, African American female appearing in NAD. Psych: Normal affect. Neuro: Alert and oriented X 3. Moves all extremities spontaneously. HEENT: Normal  Neck: Supple without bruits or JVD. Lungs:  Resp regular and unlabored, CTA without wheezing or rales. Heart: RRR no s3, s4, or murmurs. Pain not reproducible on palpation.  Abdomen: Soft, non-tender, non-distended, BS + x 4.  Extremities: No clubbing, cyanosis or edema. DP/PT/Radials 2+ and equal bilaterally.  Labs    Troponin [facility](Point of Care Test)  Recent Labs  12/18/15 2356  TROPIPOC 0.00    Recent Labs  12/18/15 2306 12/19/15 0327  TROPONINI <0.03 <0.03   Lab Results  Component Value Date   WBC 7.2 12/19/2015   HGB 11.5 (L) 12/19/2015   HCT 35.6 (L) 12/19/2015   MCV 86.0 12/19/2015   PLT 272 12/19/2015    Recent Labs Lab 12/19/15 0327  NA 138  K 3.6  CL 102  CO2 27  BUN 20  CREATININE 0.86  CALCIUM 8.7*  GLUCOSE 118*   No results found for: CHOL, HDL, LDLCALC, TRIG No results found for: [facility]   Radiology Studies    Dg Chest 2 View  Result Date: 12/18/2015 CLINICAL DATA:  Upper left chest pain EXAM: CHEST  2 VIEW COMPARISON:  None. FINDINGS: The heart size and mediastinal contours are within normal limits. Both lungs are clear. The visualized skeletal structures are unremarkable. IMPRESSION: No active cardiopulmonary disease. Electronically Signed   By: Jasmine PangKim  Fujinaga M.D.   On: 12/18/2015 23:47    EKG & Cardiac Imaging    EKG: NSR, HR 89, with no acute ST or T-wave changes.   Stress Echocardiogram: 08/31/2015 RESTING ECHOCARDIOGRAPHIC DESCRIPTIONS ---------------------------------------   LEFT VENTRICLE  Size: Normal Contraction: Normal LV Masses: No Masses  Dias.Class: Normal   RIGHT VENTRICLE  Size: Normal Free Wall: Normal Contraction: Normal RV Masses: No  Masses   PERICARDIUM Fluid: No effusion  RESTING DOPPLER ECHO and OTHER SPECIAL PROCEDURES ----------------------------   Aortic: No ARNo AS   Mitral: TRIVIAL MR No MS  Tricuspid: TRIVIAL TR No TS  Pulmonary: TRIVIAL PR No PS  STRESS ECHOCARDIOGRAPHY ------------------------------------------------------   Protocol: Treadmill (Bruce) Drugs: None Target Heart Rate: 143Maximum Predicted Heart Rate: 169 Resting ECG: Normal  TYPE STAGETIMEHRBPRPESPO2 COMMENTS -------- ----- --------- --- ------- ----- ---- ------------------------------ Berline LopesBaseline66 146/ 82PRE-PROCEDURE BACK PAIN 5/10 Stress 1180 sec. 133 161/ 91 Stress 2180 sec. 160 177/106 14/20 Stress 3 71 sec. 169 138/ 88 20/20NO CHANGE IN BACK PAIN (5/10) Recovery 11 min. 129 148/ 84 Recovery 22 min. 102 167/ 64 Recovery 34 min.91 149/ 92 Recovery 46 min.84 134/ 92 Recovery 58 min.84 130/ 92NO CHANGE IN BACK PAIN (5/10)  Stress Duration: 7.18 minutes.Max Stress H.R: 169  Target Heart Rate (143) Achieved: Yes Max. workload of  10. was achieved during exercise. HR Response: Appropriate BP Response: Resting hypertension - exaggerated response  WALL SEGMENT FINDINGS --------------------------------------------------------   RestStress  --------------- --------------- Anterior Septum Basal: NormalHyperkinetic Mid: NormalHyperkinetic Apical Septum: NormalHyperkinetic  Anterior Wall Basal: NormalHyperkinetic Mid: NormalHyperkinetic  Apical: NormalHyperkinetic   Lateral Wall Basal:  NormalHyperkinetic Mid: NormalHyperkinetic  Apical: NormalHyperkinetic   Posterior Wall Basal: NormalHyperkinetic Mid: NormalHyperkinetic  Inferior Wall Basal: NormalHyperkinetic Mid: NormalHyperkinetic  Apical: NormalHyperkinetic  Inferior Septum Basal: NormalHyperkinetic Mid: NormalHyperkinetic   EF: >55%>55%   ECG Results: EQUIVOCAL. Marland Kitchen  INTERPRETATION ---------------------------------------------------------------   NORMAL STRESS TEST. NORMAL RESTING STUDY WITH NO WALL MOTION ABNORMALITIES  AT REST AND PEAK STRESS.  NORMAL LA PRESSURES WITH NORMAL DIASTOLIC FUNCTION  VALVULAR REGURGITATION: TRIVIAL MR, TRIVIAL PR, TRIVIAL TR  NO VALVULAR STENOSIS  Maximum workload of 10.10 METs was achieved during exercise.  RESTING HYPERTENSION - EXAGGERATED RESPONSE  Assessment & Plan    1. Unstable Angina - presents with chest pressure with exertion with associated dyspnea which started yesterday. Occurred while climbing stairs and carrying laundry. Symptoms improved with rest. - she does have several cardiac risk factors including Type 2 DM, HTN, HLD, and family history of CAD with both parents having fatal MI's at ages 34 (mother) and 58 (father). - cyclic troponin values have been negative and EKG is without acute ischemic changes.  - just underwent stress testing in 08/2015 which was normal (available for review in Care Everywhere). With her continued symptoms and risk factors, definitive evaluation with a cardiac catheterization is warranted. - The patient understands that risks included but are not limited to stroke (1 in 1000), death (1 in 1000), kidney failure [usually temporary] (1 in 500), bleeding (1 in 200), allergic reaction [possibly serious] (1 in  200). Added to the cath board for this afternoon. Can have a clear liquid breakfast then NPO until after the procedure.  - currently on IV Heparin. Has also been started on ASA, low-dose BB, and statin therapy.   2. Left Arm Numbness/Tingling - occurring for the past several months. Appears to have a radicular distribution and likely of a neurologic etiology. - follow-up with PCP as outpatient.  3. HTN - reports a history of elevated BP in the past few months. Not on any medication prior to admission.  - agree with initiation of low-dose BB.   4. HLD - Lipid Panel pending. - currently on Atorvastatin 20mg  daily.  5. Type 2 DM - per admitting team  Signed, Ellsworth Lennox, PA-C 12/19/2015, 7:10 AM Pager: [phone]  I have personally seen and examined this patient with Randall An, PA-C. I agree with the assessment and plan as outlined above. She has a history of HTN, HLD, DM. Chest pain concerning for unstable angina. Strong FH of CAD. My exam shows a WDWN female in NAD. CV:RRR. Lungs: clear bilat. Ext: no edema. Labs reviewed and ok for cath. EKG shows no ischemic changes.  Plan cardiac cath with possible PCI at [facility] today.   Verne Carrow 12/19/2015 10:30 AM

## 2015-12-19 NOTE — Progress Notes (Signed)
ANTICOAGULATION CONSULT NOTE - Follow Up Consult  Pharmacy Consult for IV heparin Indication: U/A  Allergies  Allergen Reactions  . Gadolinium Derivatives     hives    Patient Measurements: Height: 5\' 6"  (167.6 cm) Weight: 197 lb 1.6 oz (89.4 kg) IBW/kg (Calculated) : 59.3 Heparin Dosing Weight: 78.7kg  Vital Signs: Temp: 98.4 F (36.9 C) (12/01 0533) Temp Source: Oral (12/01 0533) BP: 135/62 (12/01 0951) Pulse Rate: 64 (12/01 0951)  Labs:  Recent Labs  12/18/15 2306 12/19/15 0327 12/19/15 0923  HGB 11.8* 11.5*  --   HCT 36.3 35.6*  --   PLT 270 272  --   APTT  --  27  --   LABPROT  --  12.8  --   INR  --  0.96  --   HEPARINUNFRC  --   --  0.34  CREATININE 0.87 0.86  --   TROPONINI <0.03 <0.03 <0.03    Estimated Creatinine Clearance: 87.1 mL/min (by C-G formula based on SCr of 0.86 mg/dL).  Assessment: 50 yoF started on IV heparin for U/A.  Plan for cardiac cath with possible [facility].  ASA, statin, BB initiated by cardiology.    12/1: First heparin level = 0.34.  Hgb slightly low, stable.  Plts WNL.  CrCl >30 ml/min.  No bleeding or infusion issues noted.   Goal of Therapy:  Heparin level 0.3-0.7 units/ml Monitor platelets by anticoagulation protocol: Yes   Plan:  Continue IV heparin at current rate.  F/u confirmation HL in 6 hours.    Quintella Mura E 12/19/2015,11:11 AM

## 2015-12-19 NOTE — Progress Notes (Signed)
Pt in cath holding via [facility]. Awake,alert,oriented. Reports chest pain/pressure 6/10 present for two days. Awaiting cardiac cath.

## 2015-12-19 NOTE — Interval H&P Note (Signed)
Cath Lab Visit (complete for each Cath Lab visit)  Clinical Evaluation Leading to the Procedure:   ACS: No.  Non-ACS:    Anginal Classification: CCS III  Anti-ischemic medical therapy: Minimal Therapy (1 class of medications)  Non-Invasive Test Results: No non-invasive testing performed  Prior CABG: No previous CABG      History and Physical Interval Note:  12/19/2015 3:25 PM  Anna [name]  has presented today for surgery, with the diagnosis of unstable angina  The various methods of treatment have been discussed with the patient and family. After consideration of risks, benefits and other options for treatment, the patient has consented to  Procedure(s): Left Heart Cath and Coronary Angiography (N/A) as a surgical intervention .  The patient's history has been reviewed, patient examined, no change in status, stable for surgery.  I have reviewed the patient's chart and labs.  Questions were answered to the patient's satisfaction.     Nicki Guadalajarahomas Akeema Broder

## 2015-12-19 NOTE — Research (Signed)
CADLAD Informed Consent   Subject Name: Anna [name]  Subject met inclusion and exclusion criteria.  The informed consent form, study requirements and expectations were reviewed with the subject and questions and concerns were addressed prior to the signing of the consent form.  The subject verbalized understanding of the trail requirements.  The subject agreed to participate in the CADLAD trial and signed the informed consent.  The informed consent was obtained prior to performance of any protocol-specific procedures for the subject.  A copy of the signed informed consent was given to the subject and a copy was placed in the subject's medical record.  Mable Fill, Marissa Nestle 12/19/2015,14:25

## 2015-12-19 NOTE — Consult Note (Signed)
Cardiology Consult    Patient ID: Anna [name] MRN: [id number], DOB/AGE: 1965/04/20   Admit date: 12/18/2015 Date of Consult: 12/19/2015  Primary Physician: No primary care provider on file. Reason for Consult: Chest Pain Primary Cardiologist: Seen at [facility] in 08/2015  Requesting Provider: Dr. Ophelia CharterYates   History of Present Illness    Anna [name] is a 50 y.o. female with past medical history of Type 2 DM, HTN, HLD, migraine headaches, and family history of CAD who presented [facility] on 12[facility]18/2015 for evaluation of chest pain and associated left arm numbness.   She was seen at [facility] in 08/2015 for evaluation of chest pain (she lives in [place] but works in [place], [place] Cardiac enzymes remained negative. A stress echocardiogram was performed and showed no wall motion abnormalities with a normal EF of 55%. Was able to achieve 10.10 METS of activity without anginal symptoms.   Since then, she reports have a left arm numbness and tingling for the past month which is being followed by her PCP. However, starting yesterday, she developed a sternal chest pressure which occurred when climbing stairs in her home and carrying laundry. She experienced associated dyspnea. Denies any nausea, vomiting, or diaphoresis. She would lay down to rest and the pain would improve but then represent when up walking around.   She reports a mild pressure at this time but says it is not nearly as intense as yesterday. Breathing at baseline.   She denies any personal cardiac history. No history of tobacco use, alcohol use, or recreational drug use. Has a significant family history of CAD with mother having a fatal MI at age 51 and father having a fatal MI at age 51.  While admitted, labs have shown a WBC of 7.7, Hgb 11.8, and platelets 270. Na+ 140. K+ 3.5. Creatinine 0.87. Initial two troponin values have been negative. Hgb A1c and Lipid Panel are pending. CXR shows no active cardiopulmonary disease.   EKG shows NSR, HR 89, with no acute ST or T-wave changes.   Past Medical History   Past Medical History:  Diagnosis Date  . Diabetes mellitus without complication (HCC)   . Hypercholesteremia     Past Surgical History:  Procedure Laterality Date  . ABDOMINAL HYSTERECTOMY    . MYOMECTOMY     x2     Allergies  Allergies  Allergen Reactions  . Gadolinium Derivatives     hives    Inpatient Medications    . [START ON 12/20/2015] aspirin EC  81 mg Oral Daily  . atorvastatin  20 mg Oral q1800  . insulin aspart  0-15 Units Subcutaneous TID WC  . metoprolol tartrate  12.5 mg Oral BID    Family History    Family History  Problem Relation Age of Onset  . CAD Mother 5255  . CAD Father 8269    Social History    Social History   Social History  . Marital status: Single    Spouse name: N/A  . Number of children: N/A  . 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 Smoking status: Never Smoker  . Smokeless tobacco: Never Used  . Alcohol use Yes     Comment: rare  . Drug use: No  . Sexual activity: Not on file   Other Topics Concern  . Not on file   Social History Narrative  . No narrative on file  Review of Systems    General:  No chills, fever, night sweats or weight changes.  Cardiovascular:  No edema, orthopnea, palpitations, paroxysmal nocturnal dyspnea. Positive for chest pain and dyspnea on exertion.  Dermatological: No rash, lesions/masses Respiratory: No cough, dyspnea Urologic: No hematuria, dysuria Abdominal:   No nausea, vomiting, diarrhea, bright red blood per rectum, melena, or hematemesis Neurologic:  No visual changes, wkns, changes in mental status. All other systems reviewed and are otherwise negative except as noted above.  Physical Exam    Blood pressure 132/86, pulse 66, temperature 98.4 F (36.9 C), temperature source Oral, resp. rate 16, height 5\' 6"  (1.676 m), weight 197  lb 1.6 oz (89.4 kg), SpO2 100 %.  General: Pleasant, African American female appearing in NAD. Psych: Normal affect. Neuro: Alert and oriented X 3. Moves all extremities spontaneously. HEENT: Normal  Neck: Supple without bruits or JVD. Lungs:  Resp regular and unlabored, CTA without wheezing or rales. Heart: RRR no s3, s4, or murmurs. Pain not reproducible on palpation.  Abdomen: Soft, non-tender, non-distended, BS + x 4.  Extremities: No clubbing, cyanosis or edema. DP/PT/Radials 2+ and equal bilaterally.  Labs    Troponin [facility](Point of Care Test)  Recent Labs  12/18/15 2356  TROPIPOC 0.00    Recent Labs  12/18/15 2306 12/19/15 0327  TROPONINI <0.03 <0.03   Lab Results  Component Value Date   WBC 7.2 12/19/2015   HGB 11.5 (L) 12/19/2015   HCT 35.6 (L) 12/19/2015   MCV 86.0 12/19/2015   PLT 272 12/19/2015    Recent Labs Lab 12/19/15 0327  NA 138  K 3.6  CL 102  CO2 27  BUN 20  CREATININE 0.86  CALCIUM 8.7*  GLUCOSE 118*   No results found for: CHOL, HDL, LDLCALC, TRIG No results found for: [facility]   Radiology Studies    Dg Chest 2 View  Result Date: 12/18/2015 CLINICAL DATA:  Upper left chest pain EXAM: CHEST  2 VIEW COMPARISON:  None. FINDINGS: The heart size and mediastinal contours are within normal limits. Both lungs are clear. The visualized skeletal structures are unremarkable. IMPRESSION: No active cardiopulmonary disease. Electronically Signed   By: Jasmine PangKim  Fujinaga M.D.   On: 12/18/2015 23:47    EKG & Cardiac Imaging    EKG: NSR, HR 89, with no acute ST or T-wave changes.   Stress Echocardiogram: 08/31/2015 RESTING ECHOCARDIOGRAPHIC DESCRIPTIONS ---------------------------------------   LEFT VENTRICLE  Size: Normal Contraction: Normal LV Masses: No Masses  Dias.Class: Normal   RIGHT VENTRICLE  Size: Normal Free Wall: Normal Contraction: Normal RV Masses: No  Masses   PERICARDIUM Fluid: No effusion  RESTING DOPPLER ECHO and OTHER SPECIAL PROCEDURES ----------------------------   Aortic: No ARNo AS   Mitral: TRIVIAL MR No MS  Tricuspid: TRIVIAL TR No TS  Pulmonary: TRIVIAL PR No PS  STRESS ECHOCARDIOGRAPHY ------------------------------------------------------   Protocol: Treadmill (Bruce) Drugs: None Target Heart Rate: 143Maximum Predicted Heart Rate: 169 Resting ECG: Normal  TYPE STAGETIMEHRBPRPESPO2 COMMENTS -------- ----- --------- --- ------- ----- ---- ------------------------------ Berline LopesBaseline66 146/ 82PRE-PROCEDURE BACK PAIN 5/10 Stress 1180 sec. 133 161/ 91 Stress 2180 sec. 160 177/106 14/20 Stress 3 71 sec. 169 138/ 88 20/20NO CHANGE IN BACK PAIN (5/10) Recovery 11 min. 129 148/ 84 Recovery 22 min. 102 167/ 64 Recovery 34 min.91 149/ 92 Recovery 46 min.84 134/ 92 Recovery 58 min.84 130/ 92NO CHANGE IN BACK PAIN (5/10)  Stress Duration: 7.18 minutes.Max Stress H.R: 169  Target Heart Rate (143) Achieved: Yes Max. workload of  10. was achieved during exercise. HR Response: Appropriate BP Response: Resting hypertension - exaggerated response  WALL SEGMENT FINDINGS --------------------------------------------------------   RestStress  --------------- --------------- Anterior Septum Basal: NormalHyperkinetic Mid: NormalHyperkinetic Apical Septum: NormalHyperkinetic  Anterior Wall Basal: NormalHyperkinetic Mid: NormalHyperkinetic  Apical: NormalHyperkinetic   Lateral Wall Basal:  NormalHyperkinetic Mid: NormalHyperkinetic  Apical: NormalHyperkinetic   Posterior Wall Basal: NormalHyperkinetic Mid: NormalHyperkinetic  Inferior Wall Basal: NormalHyperkinetic Mid: NormalHyperkinetic  Apical: NormalHyperkinetic  Inferior Septum Basal: NormalHyperkinetic Mid: NormalHyperkinetic   EF: >55%>55%   ECG Results: EQUIVOCAL. Marland Kitchen  INTERPRETATION ---------------------------------------------------------------   NORMAL STRESS TEST. NORMAL RESTING STUDY WITH NO WALL MOTION ABNORMALITIES  AT REST AND PEAK STRESS.  NORMAL LA PRESSURES WITH NORMAL DIASTOLIC FUNCTION  VALVULAR REGURGITATION: TRIVIAL MR, TRIVIAL PR, TRIVIAL TR  NO VALVULAR STENOSIS  Maximum workload of 10.10 METs was achieved during exercise.  RESTING HYPERTENSION - EXAGGERATED RESPONSE  Assessment & Plan    1. Unstable Angina - presents with chest pressure with exertion with associated dyspnea which started yesterday. Occurred while climbing stairs and carrying laundry. Symptoms improved with rest. - she does have several cardiac risk factors including Type 2 DM, HTN, HLD, and family history of CAD with both parents having fatal MI's at ages 34 (mother) and 58 (father). - cyclic troponin values have been negative and EKG is without acute ischemic changes.  - just underwent stress testing in 08/2015 which was normal (available for review in Care Everywhere). With her continued symptoms and risk factors, definitive evaluation with a cardiac catheterization is warranted. - The patient understands that risks included but are not limited to stroke (1 in 1000), death (1 in 1000), kidney failure [usually temporary] (1 in 500), bleeding (1 in 200), allergic reaction [possibly serious] (1 in  200). Added to the cath board for this afternoon. Can have a clear liquid breakfast then NPO until after the procedure.  - currently on IV Heparin. Has also been started on ASA, low-dose BB, and statin therapy.   2. Left Arm Numbness/Tingling - occurring for the past several months. Appears to have a radicular distribution and likely of a neurologic etiology. - follow-up with PCP as outpatient.  3. HTN - reports a history of elevated BP in the past few months. Not on any medication prior to admission.  - agree with initiation of low-dose BB.   4. HLD - Lipid Panel pending. - currently on Atorvastatin 20mg  daily.  5. Type 2 DM - per admitting team  Signed, Ellsworth Lennox, PA-C 12/19/2015, 7:10 AM Pager: [phone]  I have personally seen and examined this patient with Randall An, PA-C. I agree with the assessment and plan as outlined above. She has a history of HTN, HLD, DM. Chest pain concerning for unstable angina. Strong FH of CAD. My exam shows a WDWN female in NAD. CV:RRR. Lungs: clear bilat. Ext: no edema. Labs reviewed and ok for cath. EKG shows no ischemic changes.  Plan cardiac cath with possible PCI at [facility] today.   Verne Carrow 12/19/2015 10:30 AM

## 2015-12-19 NOTE — Progress Notes (Signed)
Pharmacy - IV heparin  Assessment:    Please see note from Haynes Hoehnolleen Summe, PharmD earlier today for full details.  Briefly, 50 y.o. female on IV heparin for ACS, awaiting transfer for cath later today. Drawing HL before transfer.   Most recent heparin level therapeutic; however cardiac cath reveals no coronary disease and no plans to continue heparin  Plan:   No further heparin  Bernadene Personrew Kutler Vanvranken, PharmD, BCPS Pager: [phone] 12/19/2015, 2:49 PM

## 2015-12-19 NOTE — Progress Notes (Signed)
Pt return to [facility] via [facility] on Monitor ad IVF innfusing without any problem at site.

## 2015-12-19 NOTE — Progress Notes (Signed)
TR BAND REMOVAL  LOCATION:    Right radial  DEFLATED PER PROTOCOL:    Yes.    TIME BAND OFF / DRESSING APPLIED:    1845p   SITE UPON ARRIVAL:    Level 0  SITE AFTER BAND REMOVAL:    Level 0  CIRCULATION SENSATION AND MOVEMENT:    Within Normal Limits   Yes.    COMMENTS:   Pt able to move fingers and hand with no discomfort.  Radial pulse 2+  Clean dry dressing applied with a pressure band.

## 2015-12-19 NOTE — Progress Notes (Signed)
ANTICOAGULATION CONSULT NOTE - Initial Consult  Pharmacy Consult for IV heparin Indication: chest pain/ACS  Allergies  Allergen Reactions  . Gadolinium Derivatives     hives    Patient Measurements: Height: 5\' 6"  (167.6 cm) Weight: 197 lb 1.6 oz (89.4 kg) IBW/kg (Calculated) : 59.3 Heparin Dosing Weight: 68 kg  Vital Signs: Temp: 97.7 F (36.5 C) (12/01 0141) Temp Source: Oral (12/01 0141) BP: 145/85 (12/01 0141) Pulse Rate: 63 (12/01 0141)  Labs:  Recent Labs  12/18/15 2306  HGB 11.8*  HCT 36.3  PLT 270  CREATININE 0.87  TROPONINI <0.03    Estimated Creatinine Clearance: 86.1 mL/min (by C-G formula based on SCr of 0.87 mg/dL).   Medical History: Past Medical History:  Diagnosis Date  . Diabetes mellitus without complication (HCC)   . Hypercholesteremia     Medications:  Scheduled:  . [START ON 12/20/2015] aspirin EC  81 mg Oral Daily  . atorvastatin  20 mg Oral q1800  . heparin  4,000 Units Intravenous Once  . insulin aspart  0-15 Units Subcutaneous TID WC  . metoprolol tartrate  12.5 mg Oral BID   Infusions:  . heparin      Assessment: 50 yoF w/ hx of DM and HLD presenting with a few days of CP, numbness, piercing pain and chest heaviness.  IV heparin for ACS. No PTA anticoagulation noted.  Goal of Therapy:  Heparin level 0.3-0.7 units/ml Monitor platelets by anticoagulation protocol: Yes   Plan:  Baseline coags STAT  Heparin 4000 unit bolus x1 Start drip @ 850 units/hr Daily CBC/HL Check 1st HL in 6 hours  Lorenza EvangelistGreen, Walt Geathers R 12/19/2015,3:08 AM

## 2015-12-19 NOTE — Discharge Instructions (Signed)

## 2015-12-19 NOTE — ED Notes (Signed)
hospitalist at bedside then will take pt upstairs.

## 2015-12-19 NOTE — H&P (Signed)
History and Physical    Anna [name] ZOX:[id number] DOB: [birth year] DOA: 12/18/2015  PCP: Marijo File [facility] [place]; will be transferring to [place] at [facility] in December Consultants:  Joycie Peek - Neurology, [facility], [place] Patient coming from: home - lives with daughter in [place] (recently moved); [facility]: daughter, [phone]  Chief Complaint: chest pain  HPI: Anna [name] is a 50 y.o. female with medical history significant of DM and HLD who presents with a few days of chest pain.  Numbness from neck down left arm. Piercing pains and chest feeling heavy, worse tonight.  Didn't move a lot yesterday, but today the pain was worse with exertion.  Arm and neck pain better with morphine.  Chest pain mildly improved with rest and ASA but is still present.  Midsternal CP.  Stress echo in August was negative.  No h/o heart cath.  While admitted for chest pain in August, was found to be very hypertensive but this is not being treated.  CVD: DM, HTN?, HLD.  FH + - mother died at 64y, father 38y - both of CAD.   ED Course: Per PA-C Joy: Patient presents with chest pain as well as left sided neck pain with tingling. This patient's presentation with me to believe that the patient's complaints may be separate. The patient's chest pain complaint is suspicious for possible ACS due to description, medical history, and family history. Suspect the patient's pain in the left side of the neck extending into the arm is radicular in nature. Spoke with Dr. Ophelia Charter, hospitalist, who agreed to admit the patient to telemetry observation.  She had stress echo performed at [facility] in 08/17 with no abnormalities. This workup was done because the patient was having right-sided chest pain and pain over the carotid artery at that time.  Review of Systems: As per HPI; otherwise 10 point review of systems reviewed and negative.   Ambulatory Status:  Ambulates independently  Past Medical History:  Diagnosis Date  .  Diabetes mellitus without complication (HCC)   . Hypercholesteremia     Past Surgical History:  Procedure Laterality Date  . ABDOMINAL HYSTERECTOMY    . MYOMECTOMY     x2    Social History   Social History  . Marital status: Single    Spouse name: N/A  . Number of children: N/A  . Years of education: N/A   Occupational History  . service Fish farm manager    Social History Main Topics  . Smoking status: Never Smoker  . Smokeless tobacco: Never Used  . Alcohol use Yes     Comment: rare  . Drug use: No  . Sexual activity: Not on file   Other Topics Concern  . Not on file   Social History Narrative  . No narrative on file    Allergies  Allergen Reactions  . Gadolinium Derivatives     hives    Family History  Problem Relation Age of Onset  . CAD Mother 67  . CAD Father 40    Prior to Admission medications   Medication Sig Start Date End Date Taking? Authorizing Provider  aspirin 325 MG tablet Take 325 mg by mouth every 4 (four) hours as needed for mild pain.   Yes Historical Provider, MD  ibuprofen (ADVIL,MOTRIN) 200 MG tablet Take 400 mg by mouth every 6 (six) hours as needed for moderate pain.   Yes Historical Provider, MD  cyclobenzaprine (FLEXERIL) 10 MG tablet Take 1 tablet (10 mg total) by mouth 2 (  two) times daily as needed for muscle spasms. Patient not taking: Reported on 12/19/2015 09/04/15   Eyvonne MechanicJeffrey Hedges, PA-C  diazepam (VALIUM) 5 MG tablet Take 1 tablet (5 mg total) by mouth 2 (two) times daily. Patient not taking: Reported on 12/19/2015 09/04/15   Eyvonne MechanicJeffrey Hedges, PA-C    Physical Exam: Vitals:   12/18/15 2153  BP: 131/86  Pulse: 93  Resp: 14  Temp: 97.9 F (36.6 C)  TempSrc: Oral  SpO2: 98%  Weight: 85.7 kg (189 lb)  Height: 5\' 6"  (1.676 m)     General: Appears calm and comfortable and is NAD Eyes:  PERRL, EOMI, normal lids, iris ENT:  grossly normal hearing, lips & tongue, mmm Neck:  no LAD, masses or thyromegaly Cardiovascular:  RRR,  no m/r/g. No LE edema.  Mild increase in chest pressure with deep chest palpation. Respiratory:  CTA bilaterally, no w/r/r. Normal respiratory effort. Abdomen:  soft, ntnd, NABS Skin:  no rash or induration seen on limited exam Musculoskeletal:  grossly normal tone BUE/BLE, good ROM, no bony abnormality Psychiatric:  grossly normal mood and affect, speech fluent and appropriate, AOx3 Neurologic:  CN 2-12 grossly intact, moves all extremities in coordinated fashion, sensation intact  Labs on Admission: I have personally reviewed following labs and imaging studies  CBC:  Recent Labs Lab 12/18/15 2306  WBC 7.7  HGB 11.8*  HCT 36.3  MCV 87.5  PLT 270   Basic Metabolic Panel:  Recent Labs Lab 12/18/15 2306  NA 140  K 3.5  CL 105  CO2 28  GLUCOSE 101*  BUN 19  CREATININE 0.87  CALCIUM 8.8*   GFR: Estimated Creatinine Clearance: 84.4 mL/min (by C-G formula based on SCr of 0.87 mg/dL). Liver Function Tests: No results for input(s): AST, ALT, ALKPHOS, BILITOT, PROT, ALBUMIN in the last 168 hours. No results for input(s): LIPASE, AMYLASE in the last 168 hours. No results for input(s): AMMONIA in the last 168 hours. Coagulation Profile: No results for input(s): INR, PROTIME in the last 168 hours. Cardiac Enzymes:  Recent Labs Lab 12/18/15 2306  TROPONINI <0.03   BNP (last 3 results) No results for input(s): PROBNP in the last 8760 hours. HbA1C: No results for input(s): HGBA1C in the last 72 hours. CBG: No results for input(s): GLUCAP in the last 168 hours. Lipid Profile: No results for input(s): CHOL, HDL, LDLCALC, TRIG, CHOLHDL, LDLDIRECT in the last 72 hours. Thyroid Function Tests: No results for input(s): TSH, T4TOTAL, FREET4, T3FREE, THYROIDAB in the last 72 hours. Anemia Panel: No results for input(s): VITAMINB12, FOLATE, FERRITIN, TIBC, IRON, RETICCTPCT in the last 72 hours. Urine analysis: No results found for: COLORURINE, APPEARANCEUR, LABSPEC, PHURINE,  GLUCOSEU, HGBUR, BILIRUBINUR, KETONESUR, PROTEINUR, UROBILINOGEN, NITRITE, LEUKOCYTESUR  Creatinine Clearance: Estimated Creatinine Clearance: 84.4 mL/min (by C-G formula based on SCr of 0.87 mg/dL).  Sepsis Labs: @LABRCNTIP (procalcitonin:4,lacticidven:4) )No results found for this or any previous visit (from the past 240 hour(s)).   Radiological Exams on Admission: Dg Chest 2 View  Result Date: 12/18/2015 CLINICAL DATA:  Upper left chest pain EXAM: CHEST  2 VIEW COMPARISON:  None. FINDINGS: The heart size and mediastinal contours are within normal limits. Both lungs are clear. The visualized skeletal structures are unremarkable. IMPRESSION: No active cardiopulmonary disease. Electronically Signed   By: Jasmine PangKim  Fujinaga M.D.   On: 12/18/2015 23:47    EKG: Independently reviewed.  NSR with rate 89; T wave inversion in V1 (isolated), otherwise normal EKG  Assessment/Plan Principal Problem:   Chest pain Active  Problems:   Diabetes mellitus type 2, controlled (HCC)   Hyperlipidemia   Chest pain -Patient with substernal chest pain that is worse with exertion and improved with rest -3/3 typical symptoms suggestive of cardiac chest pain.  -CXR unremarkable.   -Initial cardiac enzymes negative.   -EKG not indicative of acute ischemia.   -TIMI risk score is 2; which predicts a 14 days risk of death, recurrent MI, or urgent revascularization of 8.3%.  -The patient is continuing to complain of chest pressure and as such was discussed with Dr. Antoine PocheHochrein; given ongoing symptoms in a patient with multiple CVD RF, it is reasonable to consider this unstable angina. -[facility] to rule out ACS. -Will start heparin drip.  -cycle cardiac enzymes q6h x 3 and repeat EKG in AM -ASA PO daily -morphine given -Start statin -Risk factor stratification with FLP and HgbA1c; will also check TSH and UDS -Cardiology consultation in AM - request for consult notified via Inbox message  to the CardsMaster -Given recent negative stress echo, patient may need a cardiac catheterization for definitive diagnosis  DM -Will check A1c -good control in the ER despite no medications -Cover with SSI  HLD -Check FLP -Start Lipitor 20 mg PO daily  DVT prophylaxis: Heparin drip Code Status:  Full - confirmed with patient Family Communication: None present  Disposition Plan:  Home once clinically improved Consults called: Cardiology (telephone consult; inbox message sent for formal consult in AM)  Admission status: Admit - It is my clinical opinion that admission to INPATIENT is reasonable and necessary because this patient will require at least 2 midnights in the hospital to treat this condition based on the medical complexity of the problems presented.  Given the aforementioned information, the predictability of an adverse outcome is felt to be significant.    Jonah BlueJennifer Kariana Wiles MD [facility]  If 7PM-7AM, please contact night-coverage [email or web address] Password [email or web address]  12/19/2015, 1:41 AM

## 2015-12-19 NOTE — Progress Notes (Signed)
   Follow Up Note  HPI: 50 year old female with past mental history of diabetes mellitus and hyperlipidemia presented on the night of 11/30 with complaints of chest pain. Patient had previous stress echo which was negative. No EKG changes, but patient has positive family history with a mom who died of an MI at a very young age. Seen by cardiology and plans for cardiac catheterization later today Pt admitted earlier this morning.  Seen after arrived to floor.    Exam: CV: Regular rate and rhythm, S1-S2 Lungs: Clear to auscultation bilaterally Abd: Soft, nontender, nondistended, positive bowel sounds Ext: No clubbing or cyanosis or edema  Present on Admission: . Chest pain . Hyperlipidemia . Unstable angina [facility](HCC): Await results of cardiac catheterization Diabetes mellitus  Disposition: Depending on results of catheterization, potential discharge home in the next 24 hours versus further intervention

## 2015-12-20 DIAGNOSIS — E119 Type 2 diabetes mellitus without complications: Secondary | ICD-10-CM

## 2015-12-20 DIAGNOSIS — I200 Unstable angina: Secondary | ICD-10-CM | POA: Diagnosis not present

## 2015-12-20 DIAGNOSIS — R030 Elevated blood-pressure reading, without diagnosis of hypertension: Secondary | ICD-10-CM

## 2015-12-20 DIAGNOSIS — E782 Mixed hyperlipidemia: Secondary | ICD-10-CM

## 2015-12-20 LAB — BASIC METABOLIC PANEL
ANION GAP: 8 (ref 5–15)
BUN: 13 mg/dL (ref 6–20)
CHLORIDE: 104 mmol/L (ref 101–111)
CO2: 25 mmol/L (ref 22–32)
Calcium: 8.6 mg/dL — ABNORMAL LOW (ref 8.9–10.3)
Creatinine, Ser: 0.76 mg/dL (ref 0.44–1.00)
GFR calc non Af Amer: >60 mL/min (ref >60)
Glucose, Bld: 93 mg/dL (ref 65–99)
POTASSIUM: 4 mmol/L (ref 3.5–5.1)
SODIUM: 137 mmol/L (ref 135–145)

## 2015-12-20 LAB — GLUCOSE, CAPILLARY: Glucose-Capillary: 95 mg/dL (ref 65–99)

## 2015-12-20 LAB — CBC
HEMATOCRIT: 34.3 % — AB (ref 36.0–46.0)
HEMOGLOBIN: 11.3 g/dL — AB (ref 12.0–15.0)
MCH: 28.3 pg (ref 26.0–34.0)
MCHC: 32.9 g/dL (ref 30.0–36.0)
MCV: 85.8 fL (ref 78.0–100.0)
Platelets: 258 10*3/uL (ref 150–400)
RBC: 4 MIL/uL (ref 3.87–5.11)
RDW: 13.5 % (ref 11.5–15.5)
WBC: 5.9 10*3/uL (ref 4.0–10.5)

## 2015-12-20 LAB — HEMOGLOBIN A1C
HEMOGLOBIN A1C: 5.9 % — AB (ref 4.8–5.6)
Mean Plasma Glucose: 123 mg/dL

## 2015-12-20 MED ORDER — ATORVASTATIN CALCIUM 20 MG PO TABS: 20.0000 mg | ORAL_TABLET | Freq: Every day | ORAL | 2 refills | Status: DC

## 2015-12-20 MED ORDER — METOPROLOL TARTRATE 25 MG PO TABS: 12.5000 mg | ORAL_TABLET | Freq: Two times a day (BID) | ORAL | 2 refills | Status: DC

## 2015-12-20 NOTE — Discharge Summary (Signed)
Discharge Summary  Anna [name] [id number] DOB: 06-22-1965  PCP: Clayborn HeronVictoria R Rankins, MD  Admit date: 12/18/2015 Discharge date: 12/20/2015  Time spent: 25 minutes   Recommendations for Outpatient Follow-up:  1. New medication: Lipitor 20 mg by mouth daily at bedtime 2. New medication: Metoprolol 12.5 mg by mouth twice a day 3. Patient will be following up with her new establish PCP Dr. Barbaraann Barthelankins in the next few weeks   Discharge Diagnoses:  Active Hospital Problems   Diagnosis Date Noted  . Unstable angina (HCC) 12/19/2015  . Elevated blood pressure  12/19/2015  . Diabetes mellitus type 2, controlled (HCC) 12/19/2015  . Hyperlipidemia 12/19/2015    Resolved Hospital Problems   Diagnosis Date Noted Date Resolved  No resolved problems to display.    Discharge Condition: Improved, being discharged home   Diet recommendation: Low-sodium   Vitals:   12/19/15 2051 12/20/15 0507  BP: 123/78 113/72  Pulse: 100 66  Resp: 16 16  Temp: 98.3 F (36.8 C) 98.1 F (36.7 C)    History of present illness:  50 year old female with past medical history of diabetes mellitus and hyperlipidemia presented on the night of 11/30 with complaints of chest pain. Patient had previous stress echo which was negative. No EKG changes, but patient has positive family history with a mom who died of an MI at a very young age.  Hospital Course:  Principal Problem:   Unstable angina [facility](HCC): See med cardiology. Taken for cardiac catheterization on 12/1. Found to have clean vessels good perfusion. Chest pain felt to be noncardiac in nature Active Problems:   Elevated blood pressure: Started by cardiology on beta blocker. Given a prescription upon discharge   Diabetes mellitus type 2, controlled (HCC): Diet-controlled   Hyperlipidemia: LDL elevated at 125. Patient is a previous history and had been noncompliant with her statin medication. Patient's started on Lipitor and will be discharged with  prescription for   Procedures:  Cardiac catheterization on 12/1: Normal   Consultations:  Cardiology   Discharge Exam: BP 113/72 (BP Location: Left Arm)   Pulse 66   Temp 98.1 F (36.7 C) (Oral)   Resp 16   Ht 5\' 6"  (1.676 m)   Wt 89.4 kg (197 lb 1.6 oz)   SpO2 100%   BMI 31.81 kg/m   General: Alert and oriented 3, no acute distress  Cardiovascular: Regular rate and rhythm, S1-S2  Respiratory: Clear to auscultation bilaterally   Discharge Instructions You were cared for by a hospitalist during your hospital stay. If you have any questions about your discharge medications or the care you received while you were in the hospital after you are discharged, you can call the unit and asked to speak with the hospitalist on call if the hospitalist that took care of you is not available. Once you are discharged, your primary care physician will handle any further medical issues. Please note that NO REFILLS for any discharge medications will be authorized once you are discharged, as it is imperative that you return to your primary care physician (or establish a relationship with a primary care physician if you do not have one) for your aftercare needs so that they can reassess your need for medications and monitor your lab values.  Discharge Instructions    Diet - low sodium heart healthy    Complete by:  As directed    Increase activity slowly    Complete by:  As directed        Medication List  TAKE these medications   aspirin 325 MG tablet Take 325 mg by mouth every 4 (four) hours as needed for mild pain.   atorvastatin 20 MG tablet Commonly known as:  LIPITOR Take 1 tablet (20 mg total) by mouth daily at 6 PM.   ibuprofen 200 MG tablet Commonly known as:  ADVIL,MOTRIN Take 400 mg by mouth every 6 (six) hours as needed for moderate pain.   metoprolol tartrate 25 MG tablet Commonly known as:  LOPRESSOR Take 0.5 tablets (12.5 mg total) by mouth 2 (two) times daily.       Allergies  Allergen Reactions  . Gadolinium Derivatives     hives      The results of significant diagnostics from this hospitalization (including imaging, microbiology, ancillary and laboratory) are listed below for reference.    Significant Diagnostic Studies: Dg Chest 2 View  Result Date: 12/18/2015 CLINICAL DATA:  Upper left chest pain EXAM: CHEST  2 VIEW COMPARISON:  None. FINDINGS: The heart size and mediastinal contours are within normal limits. Both lungs are clear. The visualized skeletal structures are unremarkable. IMPRESSION: No active cardiopulmonary disease. Electronically Signed   By: Jasmine PangKim  Fujinaga M.D.   On: 12/18/2015 23:47    Microbiology: No results found for this or any previous visit (from the past 240 hour(s)).   Labs: Basic Metabolic Panel:  Recent Labs Lab 12/18/15 2306 12/19/15 0327 12/20/15 0541  NA 140 138 137  K 3.5 3.6 4.0  CL 105 102 104  CO2 28 27 25   GLUCOSE 101* 118* 93  BUN 19 20 13   CREATININE 0.87 0.86 0.76  CALCIUM 8.8* 8.7* 8.6*   Liver Function Tests: No results for input(s): AST, ALT, ALKPHOS, BILITOT, PROT, ALBUMIN in the last 168 hours. No results for input(s): LIPASE, AMYLASE in the last 168 hours. No results for input(s): AMMONIA in the last 168 hours. CBC:  Recent Labs Lab 12/18/15 2306 12/19/15 0327 12/20/15 0541  WBC 7.7 7.2 5.9  HGB 11.8* 11.5* 11.3*  HCT 36.3 35.6* 34.3*  MCV 87.5 86.0 85.8  PLT 270 272 258   Cardiac Enzymes:  Recent Labs Lab 12/18/15 2306 12/19/15 0327 12/19/15 0923  TROPONINI <0.03 <0.03 <0.03   BNP: BNP (last 3 results) No results for input(s): BNP in the last 8760 hours.  ProBNP (last 3 results) No results for input(s): PROBNP in the last 8760 hours.  CBG:  Recent Labs Lab 12/19/15 0754 12/19/15 1132 12/20/15 0757  GLUCAP 103* 90 95       Signed:  Hollice EspyKRISHNAN,Limmie Schoenberg K, MD [facility] 12/20/2015, 12:44 PM

## 2015-12-22 ENCOUNTER — Encounter (HOSPITAL_COMMUNITY): Payer: Self-pay | Admitting: Cardiovascular Disease

## 2016-01-05 DIAGNOSIS — E119 Type 2 diabetes mellitus without complications: Secondary | ICD-10-CM | POA: Diagnosis not present

## 2016-01-05 DIAGNOSIS — I10 Essential (primary) hypertension: Secondary | ICD-10-CM | POA: Diagnosis not present

## 2016-01-05 DIAGNOSIS — G43909 Migraine, unspecified, not intractable, without status migrainosus: Secondary | ICD-10-CM | POA: Diagnosis not present

## 2016-01-05 DIAGNOSIS — E7800 Pure hypercholesterolemia, unspecified: Secondary | ICD-10-CM | POA: Diagnosis not present

## 2016-01-05 DIAGNOSIS — Z23 Encounter for immunization: Secondary | ICD-10-CM | POA: Diagnosis not present

## 2016-01-20 DIAGNOSIS — E785 Hyperlipidemia, unspecified: Secondary | ICD-10-CM | POA: Diagnosis not present

## 2016-01-20 DIAGNOSIS — E119 Type 2 diabetes mellitus without complications: Secondary | ICD-10-CM | POA: Diagnosis not present

## 2016-01-20 DIAGNOSIS — K635 Polyp of colon: Secondary | ICD-10-CM | POA: Diagnosis not present

## 2016-01-20 DIAGNOSIS — Z79899 Other long term (current) drug therapy: Secondary | ICD-10-CM | POA: Diagnosis not present

## 2016-01-20 DIAGNOSIS — K5730 Diverticulosis of large intestine without perforation or abscess without bleeding: Secondary | ICD-10-CM | POA: Diagnosis not present

## 2016-01-20 DIAGNOSIS — Z7982 Long term (current) use of aspirin: Secondary | ICD-10-CM | POA: Diagnosis not present

## 2016-01-20 DIAGNOSIS — K621 Rectal polyp: Secondary | ICD-10-CM | POA: Diagnosis not present

## 2016-01-20 DIAGNOSIS — F329 Major depressive disorder, single episode, unspecified: Secondary | ICD-10-CM | POA: Diagnosis not present

## 2016-01-20 DIAGNOSIS — Z7984 Long term (current) use of oral hypoglycemic drugs: Secondary | ICD-10-CM | POA: Diagnosis not present

## 2016-01-20 DIAGNOSIS — Z1211 Encounter for screening for malignant neoplasm of colon: Secondary | ICD-10-CM | POA: Diagnosis not present

## 2016-01-20 DIAGNOSIS — I10 Essential (primary) hypertension: Secondary | ICD-10-CM | POA: Diagnosis not present

## 2016-01-28 ENCOUNTER — Encounter: Payer: Self-pay | Admitting: Neurology

## 2016-01-28 ENCOUNTER — Ambulatory Visit (INDEPENDENT_AMBULATORY_CARE_PROVIDER_SITE_OTHER): Payer: BLUE CROSS/BLUE SHIELD | Admitting: Neurology

## 2016-01-28 DIAGNOSIS — G43709 Chronic migraine without aura, not intractable, without status migrainosus: Secondary | ICD-10-CM | POA: Diagnosis not present

## 2016-01-28 DIAGNOSIS — M62838 Other muscle spasm: Secondary | ICD-10-CM

## 2016-01-28 DIAGNOSIS — IMO0002 Reserved for concepts with insufficient information to code with codable children: Secondary | ICD-10-CM | POA: Insufficient documentation

## 2016-01-28 MED ORDER — TOPIRAMATE 100 MG PO TABS: 100.0000 mg | ORAL_TABLET | Freq: Every day | ORAL | 11 refills | Status: DC

## 2016-01-28 MED ORDER — DICLOFENAC POTASSIUM(MIGRAINE) 50 MG PO PACK: 50.0000 mg | PACK | Freq: Every day | ORAL | 11 refills | Status: DC | PRN

## 2016-01-28 MED ORDER — RIZATRIPTAN BENZOATE 5 MG PO TBDP: 5.0000 mg | ORAL_TABLET | ORAL | 6 refills | Status: DC | PRN

## 2016-01-28 MED ORDER — TIZANIDINE HCL 4 MG PO TABS: 4.0000 mg | ORAL_TABLET | Freq: Three times a day (TID) | ORAL | 6 refills | Status: DC | PRN

## 2016-01-28 NOTE — Progress Notes (Signed)
PATIENT: Anna [name] DOB: [birth year]  Chief Complaint  Patient presents with  . Migraine    Reports history of migraines over the last ten years. She estimates having four migraines per month.  She has failed gabapenting and Relpax in the past.  Verlin FesterCambia has previously been helpful.  She has muscle spasms in her neck with her migraines.  Denies nausea and vomiting.  Marland Kitchen. PCP    Clayborn HeronVictoria R Rankins, MD     HISTORICAL  Anna PeerCharise [name] is a 51 years old right-handed female, seen in refer by her primary care doctor TurkeyVictoria R Rankins for evaluation of frequent migraine, initial evaluation was January 28 2016.  I reviewed and summarized the referring note, she had a history of diabetes, hypertension, hyperlipidemia, had a hysterectomy,  She was admitted to the hospital December 18 2015 for chest pain,, cardiac catheterization on December 1 was normal, suspected noncardiac etiology for her chest pain.  Laboratory evaluations, normal BMP, with exception of mildly low calcium 8.6, normal CBC, with exception of mild anemia hemoglobin of 11.3, negative troponin, cholesterol 213, LDL 125. Normal TSH 2.2, A1c was mildly elevated 5.9,  She reported a history of migraine headaches since 2008, getting worse since 2010, her typical migraine are holocranial severe pressure to pounding headache, it happened about 3 times each week, lasting for a few hours, with associated light noise sensitivity, movement make it worse,  Over the years, she has tried over-the-counter Excedrin Migraine, with limited help, Cambia and Relpax used to be helpful, but Relpax cause excessive fatigue and sleepiness afterwards  REVIEW OF SYSTEMS: Full 14 system review of systems performed and notable only for headache, blurry vision, achy muscles  ALLERGIES: Allergies  Allergen Reactions  . Gadolinium Derivatives     hives    HOME MEDICATIONS: Current Outpatient Prescriptions  Medication Sig Dispense Refill  . aspirin 325  MG tablet Take 325 mg by mouth every 4 (four) hours as needed for mild pain.    Marland Kitchen. atorvastatin (LIPITOR) 20 MG tablet Take 1 tablet (20 mg total) by mouth daily at 6 PM. 30 tablet 2  . ibuprofen (ADVIL,MOTRIN) 200 MG tablet Take 400 mg by mouth every 6 (six) hours as needed for moderate pain.    . metoprolol tartrate (LOPRESSOR) 25 MG tablet Take 0.5 tablets (12.5 mg total) by mouth 2 (two) times daily. 30 tablet 2   No current facility-administered medications for this visit.     PAST MEDICAL HISTORY: Past Medical History:  Diagnosis Date  . Diabetes mellitus without complication (HCC)   . Hypercholesteremia   . Migraine   . Muscle spasm     PAST SURGICAL HISTORY: Past Surgical History:  Procedure Laterality Date  . ABDOMINAL HYSTERECTOMY    . CARDIAC CATHETERIZATION N/A 12/19/2015   Procedure: Left Heart Cath and Coronary Angiography;  Surgeon: Lennette Biharihomas A Kelly, MD;  [facility];  Service: Cardiovascular;  Laterality: N/A;  . MYOMECTOMY     x2  . UTERINE FIBROID SURGERY      FAMILY HISTORY: Family History  Problem Relation Age of Onset  . CAD Mother 5755  . Heart attack Mother   . CAD Father 1069  . Heart attack Father   . Lupus Sister   . Breast cancer Sister     SOCIAL HISTORY:  Social History   Social History  . Marital status: Single    Spouse name: N/A  . Number of children: 1  . Years of education: Masters  Occupational History  . service Fish farm manager    Social History Main Topics  . Smoking status: Never Smoker  . Smokeless tobacco: Never Used  . Alcohol use No  . Drug use: No  . Sexual activity: Not on file   Other Topics Concern  . Not on file   Social History Narrative   Lives at home with daughter.   Right-handed.   3 cups soda per day.     PHYSICAL EXAM   Vitals:   01/28/16 0756  BP: (!) 142/89  Pulse: 68  Weight: 199 lb 12 oz (90.6 kg)  Height: 5\' 6"  (1.676 m)    Not recorded      Body mass index is 32.24  kg/m.  PHYSICAL EXAMNIATION:  Gen: NAD, conversant, well nourised, obese, well groomed                     Cardiovascular: Regular rate rhythm, no peripheral edema, warm, nontender. Eyes: Conjunctivae clear without exudates or hemorrhage Neck: Supple, no carotid bruits. Pulmonary: Clear to auscultation bilaterally   NEUROLOGICAL EXAM:  MENTAL STATUS: Speech:    Speech is normal; fluent and spontaneous with normal comprehension.  Cognition:     Orientation to time, place and person     Normal recent and remote memory     Normal Attention span and concentration     Normal Language, naming, repeating,spontaneous speech     Fund of knowledge   CRANIAL NERVES: CN II: Visual fields are full to confrontation. Fundoscopic exam is normal with sharp discs and no vascular changes. Pupils are round equal and briskly reactive to light. CN III, IV, VI: extraocular movement are normal. No ptosis. CN V: Facial sensation is intact to pinprick in all 3 divisions bilaterally. Corneal responses are intact.  CN VII: Face is symmetric with normal eye closure and smile. CN VIII: Hearing is normal to rubbing fingers CN IX, X: Palate elevates symmetrically. Phonation is normal. CN XI: Head turning and shoulder shrug are intact CN XII: Tongue is midline with normal movements and no atrophy.  MOTOR: There is no pronator drift of out-stretched arms. Muscle bulk and tone are normal. Muscle strength is normal.  REFLEXES: Reflexes are 2+ and symmetric at the biceps, triceps, knees, and ankles. Plantar responses are flexor.  SENSORY: Intact to light touch, pinprick, positional sensation and vibratory sensation are intact in fingers and toes.  COORDINATION: Rapid alternating movements and fine finger movements are intact. There is no dysmetria on finger-to-nose and heel-knee-shin.    GAIT/STANCE: Posture is normal. Gait is steady with normal steps, base, arm swing, and turning. Heel and toe walking are  normal. Tandem gait is normal.  Romberg is absent.   DIAGNOSTIC DATA (LABS, IMAGING, TESTING) - I reviewed patient records, labs, notes, testing and imaging myself where available.   ASSESSMENT AND PLAN  Natane Heward is a 51 y.o. female   Chronic migraine headaches  Topamax 100 mg at bedtime as preventive medications  Maxalt as needed  May add on tizanidine 4 mg   Levert Feinstein, M.D. Ph.D.  [facility] [address], [address] [place], [place] [address] Ph: [phone] Fax: [phone]  CC: Clayborn Heron, MD

## 2016-04-28 ENCOUNTER — Ambulatory Visit: Payer: BLUE CROSS/BLUE SHIELD | Admitting: Nurse Practitioner

## 2016-05-20 ENCOUNTER — Ambulatory Visit: Payer: BLUE CROSS/BLUE SHIELD | Admitting: Nurse Practitioner

## 2016-05-20 ENCOUNTER — Telehealth: Payer: Self-pay

## 2016-05-20 NOTE — Telephone Encounter (Signed)
PATIENT WAS A NO SHOW TODAY FOR APPT. 

## 2016-05-24 ENCOUNTER — Encounter: Payer: Self-pay | Admitting: Nurse Practitioner

## 2016-06-04 ENCOUNTER — Ambulatory Visit (HOSPITAL_COMMUNITY)
Admission: EM | Admit: 2016-06-04 | Discharge: 2016-06-04 | Disposition: A | Discharge status: Home / Self Care | Payer: BLUE CROSS/BLUE SHIELD | Attending: Internal Medicine | Admitting: Internal Medicine

## 2016-06-04 ENCOUNTER — Encounter (HOSPITAL_COMMUNITY): Payer: Self-pay | Admitting: Emergency Medicine

## 2016-06-04 DIAGNOSIS — B349 Viral infection, unspecified: Secondary | ICD-10-CM

## 2016-06-04 DIAGNOSIS — R059 Cough, unspecified: Secondary | ICD-10-CM

## 2016-06-04 DIAGNOSIS — J029 Acute pharyngitis, unspecified: Secondary | ICD-10-CM | POA: Diagnosis not present

## 2016-06-04 DIAGNOSIS — R05 Cough: Secondary | ICD-10-CM

## 2016-06-04 DIAGNOSIS — R112 Nausea with vomiting, unspecified: Secondary | ICD-10-CM | POA: Diagnosis present

## 2016-06-04 DIAGNOSIS — R110 Nausea: Secondary | ICD-10-CM

## 2016-06-04 MED ORDER — IPRATROPIUM BROMIDE 0.06 % NA SOLN: 2.0000 | Freq: Four times a day (QID) | NASAL | 0 refills | Status: DC

## 2016-06-04 MED ORDER — ONDANSETRON 4 MG PO TBDP
4.0000 mg | ORAL_TABLET | Freq: Once | ORAL | Status: AC
  Administered 2016-06-04: 4 mg via ORAL

## 2016-06-04 MED ORDER — ONDANSETRON 4 MG PO TBDP
ORAL_TABLET | ORAL | Status: AC
  Filled 2016-06-04: qty 1

## 2016-06-04 MED ORDER — LIDOCAINE VISCOUS 2 % MT SOLN: OROMUCOSAL | 0 refills | Status: DC

## 2016-06-04 MED ORDER — ONDANSETRON 4 MG PO TBDP: 4.0000 mg | ORAL_TABLET | Freq: Three times a day (TID) | ORAL | 0 refills | Status: DC | PRN

## 2016-06-04 NOTE — ED Provider Notes (Signed)
CSN: [id number]     Arrival date & time 06/04/16  1001 History   None    Chief Complaint  Patient presents with  . Emesis  . Fever  . Sore Throat   (Consider location/radiation/quality/duration/timing/severity/associated sxs/prior Treatment) Patient c/o sore throat, fever, nausea and vomiting for 3 days.     The history is provided by the patient.  Emesis  Severity:  Moderate Duration:  3 days Timing:  Constant Number of daily episodes:  2 Quality:  Stomach contents Able to tolerate:  Liquids How soon after eating does vomiting occur:  5 minutes Progression:  Worsening Chronicity:  New Recent urination:  Normal Relieved by:  Nothing Worsened by:  Nothing Ineffective treatments:  None tried Associated symptoms: fever and sore throat   Fever  Associated symptoms: sore throat and vomiting   Sore Throat     Past Medical History:  Diagnosis Date  . Diabetes mellitus without complication (HCC)   . Hypercholesteremia   . Migraine   . Muscle spasm    Past Surgical History:  Procedure Laterality Date  . ABDOMINAL HYSTERECTOMY    . CARDIAC CATHETERIZATION N/A 12/19/2015   Procedure: Left Heart Cath and Coronary Angiography;  Surgeon: Lennette Biharihomas A Kelly, MD;  [facility];  Service: Cardiovascular;  Laterality: N/A;  . MYOMECTOMY     x2  . UTERINE FIBROID SURGERY     Family History  Problem Relation Age of Onset  . CAD Mother 8955  . Heart attack Mother   . CAD Father 3369  . Heart attack Father   . Lupus Sister   . Breast cancer Sister    Social History  Substance Use Topics  . Smoking status: Never Smoker  . Smokeless tobacco: Never Used  . Alcohol use No   OB History    No data available     Review of Systems  Constitutional: Positive for fever.  HENT: Positive for sore throat.   Eyes: Negative.   Respiratory: Negative.   Cardiovascular: Negative.   Gastrointestinal: Positive for vomiting.  Endocrine: Negative.   Genitourinary: Negative.    Musculoskeletal: Negative.   Allergic/Immunologic: Negative.   Neurological: Negative.   Hematological: Negative.   Psychiatric/Behavioral: Negative.     Allergies  Gadolinium derivatives  Home Medications   Prior to Admission medications   Medication Sig Start Date End Date Taking? Authorizing Provider  aspirin 325 MG tablet Take 325 mg by mouth every 4 (four) hours as needed for mild pain.   Yes [provider]  atorvastatin (LIPITOR) 20 MG tablet Take 1 tablet (20 mg total) by mouth daily at 6 PM. 12/20/15  Yes Hollice EspyKrishnan, Sendil K, MD  metoprolol tartrate (LOPRESSOR) 25 MG tablet Take 0.5 tablets (12.5 mg total) by mouth 2 (two) times daily. 12/20/15  Yes Hollice EspyKrishnan, Sendil K, MD  rizatriptan (MAXALT-MLT) 5 MG disintegrating tablet Take 1 tablet (5 mg total) by mouth as needed. May repeat in 2 hours if needed 01/28/16  Yes Levert FeinsteinYan, Yijun, MD  Diclofenac Potassium 50 MG PACK Take 50 mg by mouth daily as needed. 01/28/16   Levert FeinsteinYan, Yijun, MD  ibuprofen (ADVIL,MOTRIN) 200 MG tablet Take 400 mg by mouth every 6 (six) hours as needed for moderate pain.    [provider]  ipratropium (ATROVENT) 0.06 % nasal spray Place 2 sprays into both nostrils 4 (four) times daily. 06/04/16   Deatra Canterxford, Maranda Marte J, FNP  lidocaine (XYLOCAINE) 2 % solution Take 10 ml po and gargle and spit q  4 hours prn sore throat 06/04/16   Deatra Canter, FNP  ondansetron (ZOFRAN ODT) 4 MG disintegrating tablet Take 1 tablet (4 mg total) by mouth every 8 (eight) hours as needed for nausea or vomiting. 06/04/16   Deatra Canter, FNP  tiZANidine (ZANAFLEX) 4 MG tablet Take 1 tablet (4 mg total) by mouth every 8 (eight) hours as needed for muscle spasms. 01/28/16   Levert Feinstein, MD  topiramate (TOPAMAX) 100 MG tablet Take 1 tablet (100 mg total) by mouth at bedtime. 01/28/16   Levert Feinstein, MD   Meds Ordered and Administered this Visit   Medications  ondansetron (ZOFRAN-ODT) disintegrating tablet 4 mg (4 mg Oral Given  06/04/16 1034)    BP 140/89 (BP Location: Left Arm)   Pulse 88   Temp 98.4 F (36.9 C) (Oral)   SpO2 96%  No data found.   Physical Exam  Constitutional: She is oriented to person, place, and time. She appears well-developed and well-nourished.  HENT:  Head: Normocephalic and atraumatic.  Right Ear: External ear normal.  Left Ear: External ear normal.  Mouth/Throat: Oropharynx is clear and moist.  Eyes: Conjunctivae and EOM are normal. Pupils are equal, round, and reactive to light.  Neck: Normal range of motion.  Cardiovascular: Normal rate, regular rhythm and normal heart sounds.   Pulmonary/Chest: Effort normal and breath sounds normal.  Abdominal: Soft. Bowel sounds are normal.  Neurological: She is alert and oriented to person, place, and time.  Nursing note and vitals reviewed.   Urgent Care Course     Procedures (including critical care time)  Labs Review Labs Reviewed - No data to display  Imaging Review No results found.   Visual Acuity Review  Right Eye Distance:   Left Eye Distance:   Bilateral Distance:    Right Eye Near:   Left Eye Near:    Bilateral Near:         MDM   1. Viral syndrome   2. Nausea   3. Cough   4. Sore throat    Lidocaine solution 2% 10 ml po q 4 hours prn #173ml Zofran ODT 4mg  one now Atrovent nasal spray  ZofranODT 4mg  here in clinic  Work Note  Push po fluids, rest, tylenol and motrin otc prn as directed for fever, arthralgias, and myalgias.  Follow up prn if sx's continue or persist.    Deatra Canter, FNP 06/04/16 1114

## 2016-06-04 NOTE — ED Triage Notes (Signed)
Pt complains of sore throat, fever, and vomiting for the last 2-3 days. She reports a fever as high as 102 at home.  She is afebrile here today with no medication taken at home.  She states she has taken NyQuil and Mucinex with no relief.

## 2016-06-06 LAB — CULTURE, GROUP A STREP (THRC)

## 2016-06-28 ENCOUNTER — Encounter (HOSPITAL_COMMUNITY): Payer: Self-pay | Admitting: Emergency Medicine

## 2016-06-28 ENCOUNTER — Ambulatory Visit (HOSPITAL_COMMUNITY)
Admission: EM | Admit: 2016-06-28 | Discharge: 2016-06-28 | Disposition: A | Discharge status: Home / Self Care | Payer: BLUE CROSS/BLUE SHIELD | Attending: Internal Medicine | Admitting: Internal Medicine

## 2016-06-28 DIAGNOSIS — H0014 Chalazion left upper eyelid: Secondary | ICD-10-CM

## 2016-06-28 MED ORDER — ERYTHROMYCIN 5 MG/GM OP OINT: TOPICAL_OINTMENT | OPHTHALMIC | 0 refills | Status: DC

## 2016-06-28 MED ORDER — CEPHALEXIN 500 MG PO CAPS: 500.0000 mg | ORAL_CAPSULE | Freq: Four times a day (QID) | ORAL | 0 refills | Status: DC

## 2016-06-28 NOTE — ED Provider Notes (Signed)
CSN: [id number]     Arrival date & time 06/28/16  1207 History   None    Chief Complaint  Patient presents with  . Eye Pain   (Consider location/radiation/quality/duration/timing/severity/associated sxs/prior Treatment) Patient c/o pain in her right eye and radiates into her right neck.   The history is provided by the patient.  Eye Pain  This is a new problem. The problem occurs constantly. The problem has not changed since onset.Nothing aggravates the symptoms. Nothing relieves the symptoms. She has tried nothing for the symptoms.    Past Medical History:  Diagnosis Date  . Diabetes mellitus without complication (HCC)   . Hypercholesteremia   . Migraine   . Muscle spasm    Past Surgical History:  Procedure Laterality Date  . ABDOMINAL HYSTERECTOMY    . CARDIAC CATHETERIZATION N/A 12/19/2015   Procedure: Left Heart Cath and Coronary Angiography;  Surgeon: Lennette Bihari, MD;  Location: [facility] INVASIVE CV LAB;  Service: Cardiovascular;  Laterality: N/A;  . MYOMECTOMY     x2  . UTERINE FIBROID SURGERY     Family History  Problem Relation Age of Onset  . CAD Mother 32  . Heart attack Mother   . CAD Father 41  . Heart attack Father   . Lupus Sister   . Breast cancer Sister    Social History  Substance Use Topics  . Smoking status: Never Smoker  . Smokeless tobacco: Never Used  . Alcohol use No   OB History    No data available     Review of Systems  Constitutional: Negative.   HENT: Negative.   Eyes: Positive for pain.  Respiratory: Negative.   Cardiovascular: Negative.   Gastrointestinal: Negative.   Endocrine: Negative.   Genitourinary: Negative.   Musculoskeletal: Negative.   Allergic/Immunologic: Negative.   Neurological: Negative.   Hematological: Negative.     Allergies  Gadolinium derivatives  Home Medications   Prior to Admission medications   Medication Sig Start Date End Date Taking? Authorizing Provider  atorvastatin (LIPITOR) 20 MG tablet  Take 1 tablet (20 mg total) by mouth daily at 6 PM. 12/20/15  Yes Hollice Espy, MD  metoprolol tartrate (LOPRESSOR) 25 MG tablet Take 0.5 tablets (12.5 mg total) by mouth 2 (two) times daily. 12/20/15  Yes Hollice Espy, MD  cephALEXin (KEFLEX) 500 MG capsule Take 1 capsule (500 mg total) by mouth 4 (four) times daily. 06/28/16   Deatra Canter, FNP  erythromycin ophthalmic ointment Place a 1/2 inch ribbon of ointment into the lower eyelid qid 06/28/16   Deatra Canter, FNP   Meds Ordered and Administered this Visit  Medications - No data to display  BP 137/87 (BP Location: Right Arm)   Pulse 76   Temp 98.2 F (36.8 C) (Oral)   Resp 18   SpO2 99%  No data found.   Physical Exam  Constitutional: She appears well-developed and well-nourished.  HENT:  Head: Normocephalic and atraumatic.  Eyes: EOM are normal. Pupils are equal, round, and reactive to light.  Chalazion left upper eye lid.  Neck: Normal range of motion. Neck supple.  Cardiovascular: Normal rate, regular rhythm and normal heart sounds.   Pulmonary/Chest: Effort normal.  Nursing note and vitals reviewed.   Urgent Care Course     Procedures (including critical care time)  Labs Review Labs Reviewed - No data to display  Imaging Review No results found.   Visual Acuity Review  Right Eye Distance:   Left  Eye Distance:   Bilateral Distance:    Right Eye Near:   Left Eye Near:    Bilateral Near:         MDM   1. Chalazion left upper eyelid    Keflex 500mg  one po qid x 7 days #28 Erythromycin opthalmic eye ointment  Apply warm compress Follow up with Eye doctor if not better      Deatra CanterOxford, Ryian Lynde J, FNP 06/28/16 1525

## 2016-06-28 NOTE — ED Triage Notes (Signed)
[facility] a [facility] of pain and numbness in her eye that has spread to her face and neck. The patient stated that she has a stye on her left eye x 4 days.

## 2016-09-14 DIAGNOSIS — E7800 Pure hypercholesterolemia, unspecified: Secondary | ICD-10-CM | POA: Diagnosis not present

## 2016-09-14 DIAGNOSIS — I10 Essential (primary) hypertension: Secondary | ICD-10-CM | POA: Diagnosis not present

## 2016-09-14 DIAGNOSIS — E119 Type 2 diabetes mellitus without complications: Secondary | ICD-10-CM | POA: Diagnosis not present

## 2016-09-15 ENCOUNTER — Emergency Department (HOSPITAL_COMMUNITY): Payer: BLUE CROSS/BLUE SHIELD

## 2016-09-15 ENCOUNTER — Encounter (HOSPITAL_COMMUNITY): Payer: Self-pay

## 2016-09-15 DIAGNOSIS — R51 Headache: Secondary | ICD-10-CM | POA: Diagnosis not present

## 2016-09-15 DIAGNOSIS — R030 Elevated blood-pressure reading, without diagnosis of hypertension: Secondary | ICD-10-CM | POA: Insufficient documentation

## 2016-09-15 DIAGNOSIS — M542 Cervicalgia: Secondary | ICD-10-CM | POA: Diagnosis not present

## 2016-09-15 DIAGNOSIS — Z79899 Other long term (current) drug therapy: Secondary | ICD-10-CM | POA: Diagnosis not present

## 2016-09-15 DIAGNOSIS — G43109 Migraine with aura, not intractable, without status migrainosus: Secondary | ICD-10-CM | POA: Diagnosis not present

## 2016-09-15 DIAGNOSIS — R200 Anesthesia of skin: Secondary | ICD-10-CM | POA: Diagnosis present

## 2016-09-15 DIAGNOSIS — E119 Type 2 diabetes mellitus without complications: Secondary | ICD-10-CM | POA: Insufficient documentation

## 2016-09-15 LAB — DIFFERENTIAL
BASOS ABS: 0 10*3/uL (ref 0.0–0.1)
BASOS PCT: 0 %
Eosinophils Absolute: 0.1 10*3/uL (ref 0.0–0.7)
Eosinophils Relative: 1 %
LYMPHS PCT: 42 %
Lymphs Abs: 3.5 10*3/uL (ref 0.7–4.0)
MONO ABS: 0.7 10*3/uL (ref 0.1–1.0)
Monocytes Relative: 8 %
NEUTROS ABS: 4.1 10*3/uL (ref 1.7–7.7)
NEUTROS PCT: 49 %

## 2016-09-15 LAB — COMPREHENSIVE METABOLIC PANEL
ALBUMIN: 3.8 g/dL (ref 3.5–5.0)
ALK PHOS: 64 U/L (ref 38–126)
ALT: 16 U/L (ref 14–54)
AST: 16 U/L (ref 15–41)
Anion gap: 7 (ref 5–15)
BUN: 18 mg/dL (ref 6–20)
CALCIUM: 9 mg/dL (ref 8.9–10.3)
CHLORIDE: 107 mmol/L (ref 101–111)
CO2: 26 mmol/L (ref 22–32)
CREATININE: 0.93 mg/dL (ref 0.44–1.00)
GFR calc Af Amer: >60 mL/min (ref >60)
GFR calc non Af Amer: >60 mL/min (ref >60)
GLUCOSE: 103 mg/dL — AB (ref 65–99)
Potassium: 3.8 mmol/L (ref 3.5–5.1)
SODIUM: 140 mmol/L (ref 135–145)
Total Bilirubin: 0.5 mg/dL (ref 0.3–1.2)
Total Protein: 7.5 g/dL (ref 6.5–8.1)

## 2016-09-15 LAB — POCT I-STAT, CHEM 8
BUN: 20 mg/dL (ref 6–20)
CREATININE: 0.8 mg/dL (ref 0.44–1.00)
Calcium, Ion: 1.17 mmol/L (ref 1.15–1.40)
Chloride: 104 mmol/L (ref 101–111)
GLUCOSE: 101 mg/dL — AB (ref 65–99)
HCT: 36 % (ref 36.0–46.0)
HEMOGLOBIN: 12.2 g/dL (ref 12.0–15.0)
Potassium: 3.8 mmol/L (ref 3.5–5.1)
Sodium: 141 mmol/L (ref 135–145)
TCO2: 27 mmol/L (ref 22–32)

## 2016-09-15 LAB — CBC
HCT: 35 % — ABNORMAL LOW (ref 36.0–46.0)
Hemoglobin: 11.8 g/dL — ABNORMAL LOW (ref 12.0–15.0)
MCH: 28 pg (ref 26.0–34.0)
MCHC: 33.7 g/dL (ref 30.0–36.0)
MCV: 83.1 fL (ref 78.0–100.0)
PLATELETS: 287 10*3/uL (ref 150–400)
RBC: 4.21 MIL/uL (ref 3.87–5.11)
RDW: 13.4 % (ref 11.5–15.5)
WBC: 8.4 10*3/uL (ref 4.0–10.5)

## 2016-09-15 LAB — PROTIME-INR
INR: 0.95
PROTHROMBIN TIME: 12.6 s (ref 11.4–15.2)

## 2016-09-15 LAB — POCT I-STAT TROPONIN I: Troponin i, poc: 0 ng/mL (ref 0.00–0.08)

## 2016-09-15 LAB — APTT: APTT: 30 s (ref 24–36)

## 2016-09-15 NOTE — ED Triage Notes (Signed)
States blood pressure up since yesterday and saw her doctor and today numbness in left arm and neck and headache not like her typical migraine, alert and oriented x 3 clear speech noted moves all extremities steady gait noted.

## 2016-09-16 ENCOUNTER — Emergency Department (HOSPITAL_COMMUNITY)
Admission: EM | Admit: 2016-09-16 | Discharge: 2016-09-16 | Disposition: A | Discharge status: Home / Self Care | Payer: BLUE CROSS/BLUE SHIELD | Attending: Emergency Medicine | Admitting: Emergency Medicine

## 2016-09-16 DIAGNOSIS — G43109 Migraine with aura, not intractable, without status migrainosus: Secondary | ICD-10-CM

## 2016-09-16 MED ORDER — KETOROLAC TROMETHAMINE 30 MG/ML IJ SOLN
30.0000 mg | Freq: Once | INTRAMUSCULAR | Status: AC
  Administered 2016-09-16: 30 mg via INTRAVENOUS
  Filled 2016-09-16: qty 1

## 2016-09-16 MED ORDER — IBUPROFEN 200 MG PO TABS
400.0000 mg | ORAL_TABLET | Freq: Once | ORAL | Status: DC | PRN
  Filled 2016-09-16: qty 2

## 2016-09-16 MED ORDER — DIPHENHYDRAMINE HCL 50 MG/ML IJ SOLN
25.0000 mg | Freq: Once | INTRAMUSCULAR | Status: AC
  Administered 2016-09-16: 25 mg via INTRAVENOUS
  Filled 2016-09-16: qty 1

## 2016-09-16 MED ORDER — DEXAMETHASONE SODIUM PHOSPHATE 10 MG/ML IJ SOLN
10.0000 mg | Freq: Once | INTRAMUSCULAR | Status: AC
  Administered 2016-09-16: 10 mg via INTRAVENOUS
  Filled 2016-09-16: qty 1

## 2016-09-16 MED ORDER — SODIUM CHLORIDE 0.9 % IV BOLUS (SEPSIS)
1000.0000 mL | Freq: Once | INTRAVENOUS | Status: AC
  Administered 2016-09-16: 1000 mL via INTRAVENOUS

## 2016-09-16 MED ORDER — PROCHLORPERAZINE EDISYLATE 5 MG/ML IJ SOLN
10.0000 mg | Freq: Once | INTRAMUSCULAR | Status: AC
  Administered 2016-09-16: 10 mg via INTRAVENOUS
  Filled 2016-09-16: qty 2

## 2016-09-16 NOTE — ED Provider Notes (Signed)
[facility]-EMERGENCY DEPT Provider Note   CSN: [id number] Arrival date & time: 09/15/16  2012     History   Chief Complaint Chief Complaint  Patient presents with  . Numbness    HPI Anna [name] is a 51 y.o. female.  Patient presents to the ER for evaluation of headache and left-sided numbness and tingling. Patient is a frequent migraine suffer. She is having a sharp, stabbing pain into her head tonight. She has noticed some numbness and tingling on the left arm and left leg that she has not had previously with her migraines. She hasn't experiencing any weakness of the extremities. No speech disturbance.      Past Medical History:  Diagnosis Date  . Diabetes mellitus without complication (HCC)   . Hypercholesteremia   . Migraine   . Muscle spasm     Patient Active Problem List   Diagnosis Date Noted  . Chronic migraine 01/28/2016  . Neck muscle spasm 01/28/2016  . Chest pain 12/19/2015  . Diabetes mellitus type 2, controlled (HCC) 12/19/2015  . Hyperlipidemia 12/19/2015    Past Surgical History:  Procedure Laterality Date  . ABDOMINAL HYSTERECTOMY    . CARDIAC CATHETERIZATION N/A 12/19/2015   Procedure: Left Heart Cath and Coronary Angiography;  Surgeon: Lennette Bihari, MD;  Location: [facility] INVASIVE CV LAB;  Service: Cardiovascular;  Laterality: N/A;  . MYOMECTOMY     x2  . UTERINE FIBROID SURGERY      OB History    No data available       Home Medications    Prior to Admission medications   Medication Sig Start Date End Date Taking? Authorizing Provider  atorvastatin (LIPITOR) 20 MG tablet Take 1 tablet (20 mg total) by mouth daily at 6 PM. 12/20/15   Hollice Espy, MD  cephALEXin (KEFLEX) 500 MG capsule Take 1 capsule (500 mg total) by mouth 4 (four) times daily. 06/28/16   Deatra Canter, FNP  erythromycin ophthalmic ointment Place a 1/2 inch ribbon of ointment into the lower eyelid qid 06/28/16   Deatra Canter, FNP  metoprolol tartrate (LOPRESSOR)  25 MG tablet Take 0.5 tablets (12.5 mg total) by mouth 2 (two) times daily. 12/20/15   Hollice Espy, MD    Family History Family History  Problem Relation Age of Onset  . CAD Mother 90  . Heart attack Mother   . CAD Father 13  . Heart attack Father   . Lupus Sister   . Breast cancer Sister     Social History Social History  Substance Use Topics  . Smoking status: Never Smoker  . Smokeless tobacco: Never Used  . Alcohol use No     Allergies   Gadolinium derivatives   Review of Systems Review of Systems  Musculoskeletal: Positive for neck pain.  Neurological: Positive for numbness and headaches.  All other systems reviewed and are negative.    Physical Exam Updated Vital Signs BP (!) 150/88 (BP Location: Left Arm)   Pulse 63   Temp 97.7 F (36.5 C) (Oral)   Resp 13   SpO2 100%   Physical Exam  Constitutional: She is oriented to person, place, and time. She appears well-developed and well-nourished. No distress.  HENT:  Head: Normocephalic and atraumatic.  Right Ear: Hearing normal.  Left Ear: Hearing normal.  Nose: Nose normal.  Mouth/Throat: Oropharynx is clear and moist and mucous membranes are normal.  Eyes: Pupils are equal, round, and reactive to light. Conjunctivae and EOM are normal.  Neck: Normal range of motion. Neck supple.  Cardiovascular: Regular rhythm, S1 normal and S2 normal.  Exam reveals no gallop and no friction rub.   No murmur heard. Pulmonary/Chest: Effort normal and breath sounds normal. No respiratory distress. She exhibits no tenderness.  Abdominal: Soft. Normal appearance and bowel sounds are normal. There is no hepatosplenomegaly. There is no tenderness. There is no rebound, no guarding, no tenderness at McBurney's point and negative Murphy's sign. No hernia.  Musculoskeletal: Normal range of motion.  Neurological: She is alert and oriented to person, place, and time. She has normal strength. No cranial nerve deficit or sensory  deficit. Coordination normal. GCS eye subscore is 4. GCS verbal subscore is 5. GCS motor subscore is 6.  Extraocular muscle movement: normal No visual field cut Pupils: equal and reactive both direct and consensual response is normal No nystagmus present    Sensory function is intact to light touch, pinprick Proprioception intact  Grip strength 5/5 symmetric in upper extremities No pronator drift Normal finger to nose bilaterally  Lower extremity strength 5/5 against gravity Normal heel to shin bilaterally  Gait: normal   Skin: Skin is warm, dry and intact. No rash noted. No cyanosis.  Psychiatric: She has a normal mood and affect. Her speech is normal and behavior is normal. Thought content normal.  Nursing note and vitals reviewed.    ED Treatments / Results  Labs (all labs ordered are listed, but only abnormal results are displayed) Labs Reviewed  CBC - Abnormal; Notable for the following:       Result Value   Hemoglobin 11.8 (*)    HCT 35.0 (*)    All other components within normal limits  COMPREHENSIVE METABOLIC PANEL - Abnormal; Notable for the following:    Glucose, Bld 103 (*)    All other components within normal limits  POCT I-STAT, CHEM 8 - Abnormal; Notable for the following:    Glucose, Bld 101 (*)    All other components within normal limits  PROTIME-INR  APTT  DIFFERENTIAL  I-STAT TROPONIN, ED  CBG MONITORING, ED  I-STAT CHEM 8, ED  POCT I-STAT TROPONIN I    EKG  EKG Interpretation  Date/Time:  Wednesday September 15 2016 20:41:00 EDT Ventricular Rate:  64 PR Interval:    QRS Duration: 86 QT Interval:  398 QTC Calculation: 411 R Axis:   55 Text Interpretation:  Sinus rhythm Normal ECG Confirmed by Gilda CreasePollina, Jeanette Moffatt J [phone]([id number]) on 09/16/2016 3:51:00 AM       Radiology Ct Head Wo Contrast  Result Date: 09/15/2016 CLINICAL DATA:  Numbness in the left arm with headache EXAM: CT HEAD WITHOUT CONTRAST TECHNIQUE: Contiguous axial images were  obtained from the base of the skull through the vertex without intravenous contrast. COMPARISON:  None. FINDINGS: Brain: No evidence of acute infarction, hemorrhage, hydrocephalus, extra-axial collection or mass lesion/mass effect. Vascular: No hyperdense vessel or unexpected calcification. Skull: Normal. Negative for fracture or focal lesion. Sinuses/Orbits: No acute finding. Other: None IMPRESSION: No CT evidence for acute intracranial abnormality. Electronically Signed   By: Jasmine PangKim  Fujinaga M.D.   On: 09/15/2016 21:09    Procedures Procedures (including critical care time)  Medications Ordered in ED Medications  ibuprofen (ADVIL,MOTRIN) tablet 400 mg (not administered)  sodium chloride 0.9 % bolus 1,000 mL (0 mLs Intravenous Stopped 09/16/16 0533)  ketorolac (TORADOL) 30 MG/ML injection 30 mg (30 mg Intravenous Given 09/16/16 0416)  prochlorperazine (COMPAZINE) injection 10 mg (10 mg Intravenous Given 09/16/16 0416)  diphenhydrAMINE (  BENADRYL) injection 25 mg (25 mg Intravenous Given 09/16/16 0417)  dexamethasone (DECADRON) injection 10 mg (10 mg Intravenous Given 09/16/16 0417)     Initial Impression / Assessment and Plan / ED Course  I have reviewed the triage vital signs and the nursing notes.  Pertinent labs & imaging results that were available during my care of the patient were reviewed by me and considered in my medical decision making (see chart for details).     Patient presents to the emergency part with complaints of headache and left-sided numbness and tingling. Patient does have a history of chronic recurring migraine. She has not, however, had any significant neurologic features with her headaches other than the head pain in the past. She underwent stroke evaluation. CT head, lab work is unremarkable. Examination is normal. She does not have any neurologic findings on examination. Suspect that this is a Complicated migraine. Patient treated with migraine cocktail. She has had  resolution of her headache as well as numbness and tingling. She will discharge and follow-up with primary care/neurology.  Final Clinical Impressions(s) / ED Diagnoses   Final diagnoses:  Complicated migraine    New Prescriptions New Prescriptions   No medications on file     Gilda Crease, MD 09/16/16 [phone]

## 2016-09-19 DIAGNOSIS — R200 Anesthesia of skin: Secondary | ICD-10-CM | POA: Diagnosis not present

## 2016-09-19 DIAGNOSIS — R51 Headache: Secondary | ICD-10-CM | POA: Diagnosis not present

## 2016-09-19 DIAGNOSIS — R609 Edema, unspecified: Secondary | ICD-10-CM | POA: Diagnosis not present

## 2016-09-22 ENCOUNTER — Telehealth: Payer: Self-pay | Admitting: *Deleted

## 2016-09-22 ENCOUNTER — Ambulatory Visit (INDEPENDENT_AMBULATORY_CARE_PROVIDER_SITE_OTHER): Payer: BLUE CROSS/BLUE SHIELD | Admitting: Neurology

## 2016-09-22 ENCOUNTER — Encounter: Payer: Self-pay | Admitting: Neurology

## 2016-09-22 VITALS — BP 151/93 | HR 84 | Ht 65.0 in | Wt 193.5 lb

## 2016-09-22 DIAGNOSIS — R51 Headache: Secondary | ICD-10-CM | POA: Diagnosis not present

## 2016-09-22 DIAGNOSIS — G43709 Chronic migraine without aura, not intractable, without status migrainosus: Secondary | ICD-10-CM

## 2016-09-22 DIAGNOSIS — R519 Headache, unspecified: Secondary | ICD-10-CM | POA: Insufficient documentation

## 2016-09-22 DIAGNOSIS — IMO0002 Reserved for concepts with insufficient information to code with codable children: Secondary | ICD-10-CM

## 2016-09-22 MED ORDER — RIZATRIPTAN BENZOATE 10 MG PO TBDP: 10.0000 mg | ORAL_TABLET | ORAL | 11 refills | Status: DC | PRN

## 2016-09-22 MED ORDER — TIZANIDINE HCL 4 MG PO TABS: 4.0000 mg | ORAL_TABLET | Freq: Four times a day (QID) | ORAL | 6 refills | Status: DC | PRN

## 2016-09-22 MED ORDER — ONDANSETRON 4 MG PO TBDP: 4.0000 mg | ORAL_TABLET | Freq: Three times a day (TID) | ORAL | 6 refills | Status: DC | PRN

## 2016-09-22 MED ORDER — DICLOFENAC POTASSIUM(MIGRAINE) 50 MG PO PACK: 50.0000 mg | PACK | ORAL | 6 refills | Status: DC | PRN

## 2016-09-22 NOTE — Progress Notes (Signed)
PATIENT: Anna [name] DOB: September 06, 1965  Chief Complaint  Patient presents with  . Migraine    Reports having migraine since 09/14/16 that failed to respond to OTC NSAIDS.  She went to the ED for IV medications on 09/16/16 and 09/19/16 but the pain is still present.      HISTORICAL  Anna [name] is a 51 years old right-handed female, seen in refer by her primary care doctor Turkey R Rankins for evaluation of frequent migraine, initial evaluation was January 28 2016.  I reviewed and summarized the referring note, she had a history of diabetes, hypertension, hyperlipidemia, had a hysterectomy,  She was admitted to the hospital December 18 2015 for chest pain,, cardiac catheterization on December 1 was normal, suspected noncardiac etiology for her chest pain.  Laboratory evaluations, normal BMP, with exception of mildly low calcium 8.6, normal CBC, with exception of mild anemia hemoglobin of 11.3, negative troponin, cholesterol 213, LDL 125. Normal TSH 2.2, A1c was mildly elevated 5.9,  She reported a history of migraine headaches since 2008, getting worse since 2010, her typical migraine are holocranial severe pressure to pounding headache, it happened about 3 times each week, lasting for a few hours, with associated light noise sensitivity, movement make it worse,  Over the years, she has tried over-the-counter Excedrin Migraine, with limited help, Cambia and Relpax used to be helpful, but Relpax cause excessive fatigue and sleepiness afterwards  UPDATE Sept 5 2018: She started to have severe pounding headache on the left side since September 14 2016, she has tried home over-the-counter medication Excedrin Migraine, and NSAIDs without helping her headache, presented to the emergency room twice, August 30, September 2, received migraine cocktail, only with temporary relief of her headaches,  She has stopped the Topamax 100 mg every night as migraine prevention, concerning about the side  effect, she felt mental slowing, she has not refilled her Maxalt as previously prescribed,  She has severe prolonged migraine headaches every 1 or 2 months. Does not want to take prescription preventive medication  REVIEW OF SYSTEMS: Full 14 system review of systems performed and notable only for headache, blurry vision, achy muscles  ALLERGIES: Allergies  Allergen Reactions  . Gadolinium Derivatives     hives    HOME MEDICATIONS: Current Outpatient Prescriptions  Medication Sig Dispense Refill  . atorvastatin (LIPITOR) 20 MG tablet Take 1 tablet (20 mg total) by mouth daily at 6 PM. 30 tablet 2  . diclofenac (VOLTAREN) 75 MG EC tablet Take by mouth.    . erythromycin ophthalmic ointment Place a 1/2 inch ribbon of ointment into the lower eyelid qid 3.5 g 0  . gabapentin (NEURONTIN) 300 MG capsule Take by mouth.    . metFORMIN (GLUCOPHAGE-XR) 500 MG 24 hr tablet Take by mouth.    . metoprolol tartrate (LOPRESSOR) 25 MG tablet Take 0.5 tablets (12.5 mg total) by mouth 2 (two) times daily. 30 tablet 2   No current facility-administered medications for this visit.     PAST MEDICAL HISTORY: Past Medical History:  Diagnosis Date  . Diabetes mellitus without complication (HCC)   . Hypercholesteremia   . Migraine   . Muscle spasm     PAST SURGICAL HISTORY: Past Surgical History:  Procedure Laterality Date  . ABDOMINAL HYSTERECTOMY    . CARDIAC CATHETERIZATION N/A 12/19/2015   Procedure: Left Heart Cath and Coronary Angiography;  Surgeon: Lennette Bihari, MD;  Location: [facility] INVASIVE CV LAB;  Service: Cardiovascular;  Laterality: N/A;  . MYOMECTOMY  x2  . UTERINE FIBROID SURGERY      FAMILY HISTORY: Family History  Problem Relation Age of Onset  . CAD Mother 4555  . Heart attack Mother   . CAD Father 3069  . Heart attack Father   . Lupus Sister   . Breast cancer Sister     SOCIAL HISTORY:  Social History   Social History  . Marital status: Single    Spouse name: N/A    . Number of children: 1  . Years of education: Masters   Occupational History  . service Fish farm manageraccess manager    Social History Main Topics  . Smoking status: Never Smoker  . Smokeless tobacco: Never Used  . Alcohol use No  . Drug use: No  . Sexual activity: Not on file   Other Topics Concern  . Not on file   Social History Narrative   Lives at home with daughter.   Right-handed.   3 cups soda per day.     PHYSICAL EXAM   Vitals:   09/22/16 0931  BP: (!) 151/93  Pulse: 84  Weight: 193 lb 8 oz (87.8 kg)  Height: 5\' 5"  (1.651 m)    Not recorded      Body mass index is 32.2 kg/m.  PHYSICAL EXAMNIATION:  Gen: NAD, conversant, well nourised, obese, well groomed                     Cardiovascular: Regular rate rhythm, no peripheral edema, warm, nontender. Eyes: Conjunctivae clear without exudates or hemorrhage Neck: Supple, no carotid bruits. Pulmonary: Clear to auscultation bilaterally   NEUROLOGICAL EXAM:  MENTAL STATUS: Speech:    Speech is normal; fluent and spontaneous with normal comprehension.  Cognition:     Orientation to time, place and person     Normal recent and remote memory     Normal Attention span and concentration     Normal Language, naming, repeating,spontaneous speech     Fund of knowledge   CRANIAL NERVES: CN II: Visual fields are full to confrontation. Fundoscopic exam is normal with sharp discs and no vascular changes. Pupils are round equal and briskly reactive to light. CN III, IV, VI: extraocular movement are normal. No ptosis. CN V: Facial sensation is intact to pinprick in all 3 divisions bilaterally. Corneal responses are intact.  CN VII: Face is symmetric with normal eye closure and smile. CN VIII: Hearing is normal to rubbing fingers CN IX, X: Palate elevates symmetrically. Phonation is normal. CN XI: Head turning and shoulder shrug are intact CN XII: Tongue is midline with normal movements and no atrophy.  MOTOR: There is no  pronator drift of out-stretched arms. Muscle bulk and tone are normal. Muscle strength is normal.  REFLEXES: Reflexes are 2+ and symmetric at the biceps, triceps, knees, and ankles. Plantar responses are flexor.  SENSORY: Intact to light touch, pinprick, positional sensation and vibratory sensation are intact in fingers and toes.  COORDINATION: Rapid alternating movements and fine finger movements are intact. There is no dysmetria on finger-to-nose and heel-knee-shin.    GAIT/STANCE: Posture is normal. Gait is steady with normal steps, base, arm swing, and turning. Heel and toe walking are normal. Tandem gait is normal.  Romberg is absent.   DIAGNOSTIC DATA (LABS, IMAGING, TESTING) - I reviewed patient records, labs, notes, testing and imaging myself where available.   ASSESSMENT AND PLAN  Malena PeerCharise Kuk is a 51 y.o. female   Chronic migraine headaches  I have suggested  magnesium oxide 400 mg, riboflavin 100 mg twice a day as preventive medications  Maxalt as needed, may combine it together with Zofran, tizanidine, Aleve as needed.  Referred to headache specialist Dr. Lucia Gaskins.  Also performed nerve block today   Levert Feinstein, M.D. Ph.D.  [facility] [address], [address] [place], [place] [address] Ph: [phone] Fax: [phone]  CC: Clayborn Heron, MD

## 2016-09-22 NOTE — Progress Notes (Signed)
   History:  51 years old female, presented with prolonged left side migraine headaches since September 14 2016, currently complains of 8 out of 10 pain, failed multiple home remedies, and to emergency room visit cocktail treatment.  Bilateral occipital and trigeminal nerve block; trigger point injection of bilateral cervical and upper trapezius muscles for intractable headache  Bupivacaine 0.5% was injected on the scalp bilaterally at several locations:  -On the occipital area of the head, 3 injections each side, 0.5 cc per injection at the midpoint between the mastoid process and the occipital protuberance. 2 other injections were done one finger breadth from the initial injection, one at a 10 o'clock position and the other at a 2 o'clock position.  -2 injections of 0.5 cc were done in the temporal regions, 2 fingerbreadths above the tragus of the ear, with the second injection one fingerbreadth posteriorly to the first.  -2 injections were done on the brow, 1 in the medial brow and one over the supraorbital nerve notch, with 0.1 cc for each injection  -1 injection each side of 0.5 cc was done anterior to the tragus of the ear for a trigeminal ganglion injection  -0.5 cc was injected into bilateral upper trapezius and bilateral upper cervical paraspinals   The patient tolerated the injections well, no complications of the procedure were noted. Injections were made with a 27-gauge needle.  She reported immediate relief after nerve block, headache disappeared after nerve block

## 2016-09-22 NOTE — Telephone Encounter (Signed)
Called pt insurance. Spoke with Lyn HollingsheadAlexander Completed PA for rizatriptan over the phone. Pt has tried/failed: relapx, cambia, excedrin migraine.  She gets nauseous during migraines and needs ODT over regular tablet.   PA approved effected 09/01/16-03/21/17.  BJ#[id number]. He will fax notice of approval to fax: [phone] and notify pt as well.   [facility] Fax: [phone]. Received confirmation.

## 2016-09-22 NOTE — Patient Instructions (Signed)
For migraine prevention:  Magnesium oxide 400 mg twice a day Riboflavin 100 mg twice a day  For migraine abortive treatment:  You may take  Maxalt as needed, can combine it together with Zofran, tizanidine, Aleve, even Benadryl, and go to sleep

## 2016-12-22 ENCOUNTER — Ambulatory Visit: Payer: BLUE CROSS/BLUE SHIELD | Admitting: Neurology

## 2017-01-24 ENCOUNTER — Other Ambulatory Visit: Payer: Self-pay | Admitting: Family Medicine

## 2017-01-24 DIAGNOSIS — Z1231 Encounter for screening mammogram for malignant neoplasm of breast: Secondary | ICD-10-CM

## 2017-01-24 DIAGNOSIS — E119 Type 2 diabetes mellitus without complications: Secondary | ICD-10-CM | POA: Diagnosis not present

## 2017-01-24 DIAGNOSIS — I10 Essential (primary) hypertension: Secondary | ICD-10-CM | POA: Diagnosis not present

## 2017-01-24 DIAGNOSIS — E7800 Pure hypercholesterolemia, unspecified: Secondary | ICD-10-CM | POA: Diagnosis not present

## 2017-01-27 DIAGNOSIS — I10 Essential (primary) hypertension: Secondary | ICD-10-CM | POA: Diagnosis not present

## 2017-01-27 DIAGNOSIS — E119 Type 2 diabetes mellitus without complications: Secondary | ICD-10-CM | POA: Diagnosis not present

## 2017-01-27 DIAGNOSIS — E7800 Pure hypercholesterolemia, unspecified: Secondary | ICD-10-CM | POA: Diagnosis not present

## 2017-01-27 DIAGNOSIS — Z202 Contact with and (suspected) exposure to infections with a predominantly sexual mode of transmission: Secondary | ICD-10-CM | POA: Diagnosis not present

## 2017-02-02 ENCOUNTER — Ambulatory Visit: Payer: BLUE CROSS/BLUE SHIELD | Admitting: Neurology

## 2017-02-02 ENCOUNTER — Encounter: Payer: Self-pay | Admitting: Neurology

## 2017-02-02 VITALS — BP 125/90 | HR 68 | Ht 66.0 in | Wt 216.0 lb

## 2017-02-02 DIAGNOSIS — G43709 Chronic migraine without aura, not intractable, without status migrainosus: Secondary | ICD-10-CM

## 2017-02-02 MED ORDER — GABAPENTIN 300 MG PO CAPS: 300.0000 mg | ORAL_CAPSULE | Freq: Three times a day (TID) | ORAL | 11 refills | Status: DC

## 2017-02-02 MED ORDER — ONDANSETRON 4 MG PO TBDP: 4.0000 mg | ORAL_TABLET | Freq: Three times a day (TID) | ORAL | 6 refills | Status: DC | PRN

## 2017-02-02 MED ORDER — METOPROLOL TARTRATE 25 MG PO TABS: 12.5000 mg | ORAL_TABLET | Freq: Two times a day (BID) | ORAL | 6 refills | Status: DC

## 2017-02-02 MED ORDER — RIZATRIPTAN BENZOATE 10 MG PO TBDP: 10.0000 mg | ORAL_TABLET | ORAL | 11 refills | Status: AC | PRN

## 2017-02-02 MED ORDER — ATORVASTATIN CALCIUM 20 MG PO TABS: 20.0000 mg | ORAL_TABLET | Freq: Every day | ORAL | 6 refills | Status: AC

## 2017-02-02 NOTE — Patient Instructions (Addendum)
Magnesium citrate 400mg  to 600mg  daily, riboflavin 400mg , Coenzyme Q 10 100mg  three times daily  For migraine prevention:   Metoprolol and Gabapentin daily medications  For when you have a migraine (acute management) as needed:  Rizatriptan: Please take one tablet at the onset of your headache. If it does not improve the symptoms please take one additional tablet in 2 hours. Do not take more then 2 tablets in 24hrs. Do not take use more then 2 to 3 times in a week.  Ondansetron: as needed for nausea

## 2017-02-02 NOTE — Progress Notes (Signed)
[facility]    Provider:  Dr Lucia Gaskins Referring Provider: Clayborn Heron, MD Primary Care Physician:  Clayborn Heron, MD  CC: Migraines  HPI:  Anna [name] is a 52 y.o. female here as a referral from Dr. Barbaraann Barthel for migraines.  She has a past medical history of migraines, muscle spasms, high cholesterol, and diabetes.  This is the first time I am seeing her for migraines, she had previously seen one of my colleagues here at [facility].  She has a history of migraines since 2008, had been worsening since 2010.  Her typical migraines are all over her head with severe pressure, pounding and pulsating, associated light and noise sensitivity, movement makes it worse.  Also with nausea no vomiting.  She is tried multiple over-the-counter medications such as Excedrin, Cambia and Relpax used to be helpful Relpax caused excessive fatigue and sleepiness afterward.  She was seen with an intractable headache by my colleague and given nerve blocks in September 2018.  The nerve blocks helped.  Unfortunately patient has been off all her medications for many months unclear reason why, today I told her we should restart all her medications including gabapentin and metoprolol which are both migraine preventatives, she has Cambia, Zanaflex, Maxalt and Zofran for acute management.  15/30 migraine days a month. 5 severe migraines a month, 6 a mild-moderate and 4 low-level dull. The nerve blcoks helped.  Has not been on her medications.   Reviewed notes, labs and imaging from outside physicians, which showed:  Cbc/cmp unremarkable  CT head 08/2016 showed No acute intracranial abnormalities including mass lesion or mass effect, hydrocephalus, extra-axial fluid collection, midline shift, hemorrhage, or acute infarction, large ischemic events (personally reviewed images)  Magnesium citrate 400mg  to 600mg  daily, riboflavin 400mg , Coenzyme Q 10 100mg  three times daily  For  migraine prevention:   Metoprolol and Gabapentin daily medications  For when you have a migraine (acute management) as needed:  Rizatriptan: Please take one tablet at the onset of your headache. If it does not improve the symptoms please take one additional tablet in 2 hours. Do not take more then 2 tablets in 24hrs. Do not take use more then 2 to 3 times in a week.  Ondansetron: as needed for nausea   Review of Systems: Patient complains of symptoms per HPI as well as the following symptoms: headache. Pertinent negatives and positives per HPI. All others negative.   Social History   Socioeconomic History  . Marital status: Single    Spouse name: Not on file  . Number of children: 1  . Years of education: Masters  . Highest education level: Not on file  Social Needs  . Financial resource strain: Not on file  . Food insecurity - worry: Not on file  . Food insecurity - inability: Not on file  . Transportation needs - medical: Not on file  . Transportation needs - non-medical: Not on file  Occupational History  . Occupation: Chief Technology Officer  Tobacco Use  . Smoking status: Never Smoker  . Smokeless tobacco: Never Used  Substance and Sexual Activity  . Alcohol use: No  . Drug use: No  . Sexual activity: Not on file  Other Topics Concern  . Not on file  Social History Narrative   Lives at home alone   Right-handed.   2 cups soda per day.    Family History  Problem Relation Age of Onset  . CAD Mother 53  . Heart attack  Mother   . CAD Father 469  . Heart attack Father   . Lupus Sister   . Breast cancer Sister     Past Medical History:  Diagnosis Date  . Diabetes mellitus without complication (HCC)   . Hypercholesteremia   . Migraine   . Muscle spasm     Past Surgical History:  Procedure Laterality Date  . ABDOMINAL HYSTERECTOMY    . CARDIAC CATHETERIZATION N/A 12/19/2015   Procedure: Left Heart Cath and Coronary Angiography;  Surgeon: Lennette Biharihomas A Kelly,  MD;  [facility];  Service: Cardiovascular;  Laterality: N/A;  . MYOMECTOMY     x2  . UTERINE FIBROID SURGERY      Current Outpatient Medications  Medication Sig Dispense Refill  . atorvastatin (LIPITOR) 20 MG tablet Take 1 tablet (20 mg total) by mouth daily at 6 PM. 30 tablet 6  . metFORMIN (GLUCOPHAGE-XR) 500 MG 24 hr tablet Take by mouth.    . metoprolol tartrate (LOPRESSOR) 25 MG tablet Take 0.5 tablets (12.5 mg total) by mouth 2 (two) times daily. 30 tablet 6  . diclofenac (VOLTAREN) 75 MG EC tablet Take by mouth.    . Diclofenac Potassium (CAMBIA) 50 MG PACK Take 50 mg by mouth as needed. (Patient not taking: Reported on 02/02/2017) 15 each 6  . gabapentin (NEURONTIN) 300 MG capsule Take 1 capsule (300 mg total) by mouth 3 (three) times daily. 90 capsule 11  . ondansetron (ZOFRAN ODT) 4 MG disintegrating tablet Take 1 tablet (4 mg total) by mouth every 8 (eight) hours as needed. 20 tablet 6  . rizatriptan (MAXALT-MLT) 10 MG disintegrating tablet Take 1 tablet (10 mg total) by mouth as needed. May repeat in 2 hours if needed 15 tablet 11  . tiZANidine (ZANAFLEX) 4 MG tablet Take 1 tablet (4 mg total) by mouth every 6 (six) hours as needed for muscle spasms. (Patient not taking: Reported on 02/02/2017) 30 tablet 6   No current facility-administered medications for this visit.     Allergies as of 02/02/2017 - Review Complete 02/02/2017  Allergen Reaction Noted  . Gadolinium derivatives  09/04/2015    Vitals: BP 125/90 (BP Location: Right Arm, Patient Position: Sitting)   Pulse 68   Ht 5\' 6"  (1.676 m)   Wt 216 lb (98 kg)   BMI 34.86 kg/m  Last Weight:  Wt Readings from Last 1 Encounters:  02/02/17 216 lb (98 kg)   Last Height:   Ht Readings from Last 1 Encounters:  02/02/17 5\' 6"  (1.676 m)    Physical exam: Exam: Gen: NAD, conversant, well nourised, obese, well groomed                     CV: RRR, no MRG. No Carotid Bruits. No peripheral edema, warm,  nontender Eyes: Conjunctivae clear without exudates or hemorrhage  Neuro: Detailed Neurologic Exam  Speech:    Speech is normal; fluent and spontaneous with normal comprehension.  Cognition:    The patient is oriented to person, place, and time;     recent and remote memory intact;     language fluent;     normal attention, concentration,     fund of knowledge Cranial Nerves:    The pupils are equal, round, and reactive to light. The fundi are normal and spontaneous venous pulsations are present. Visual fields are full to finger confrontation. Extraocular movements are intact. Trigeminal sensation is intact and the muscles of mastication are normal. The face  is symmetric. The palate elevates in the midline. Hearing intact. Voice is normal. Shoulder shrug is normal. The tongue has normal motion without fasciculations.   Coordination:    Normal finger to nose and heel to shin. Normal rapid alternating movements.   Gait:    Heel-toe and tandem gait are normal.   Motor Observation:    No asymmetry, no atrophy, and no involuntary movements noted. Tone:    Normal muscle tone.    Posture:    Posture is normal. normal erect    Strength:    Strength is V/V in the upper and lower limbs.      Sensation: intact to LT     Reflex Exam:  DTR's:    Deep tendon reflexes in the upper and lower extremities are normal bilaterally.   Toes:    The toes are downgoing bilaterally.   Clonus:    Clonus is absent.      Assessment/Plan: This is a 52 year old patient here for follow-up of chronic migraine disorder.  Patient has not been taking any of her medications for many months, unclear why, today the plan would be to restart her on her regular medications which includes multiple migraine preventatives and medications for acute management.  Patient to return to clinic in several months so that we can further discuss and tweak her migraine management.  But since she has not been taking any of  her medications, at this time we will just restart them.  - Unfortunately patient has been off all her medications for many months unclear reason why, today I told her we should restart all her medications which include gabapentin and metoprolol which are both migraine preventatives, she has Cambia, Zanaflex, Maxalt and Zofran for acute management.  She doesn't like needles, not interested in botox or ajovy  Discussed: To prevent or relieve headaches, try the following: Cool Compress. Lie down and place a cool compress on your head.  Avoid headache triggers. If certain foods or odors seem to have triggered your migraines in the past, avoid them. A headache diary might help you identify triggers.  Include physical activity in your daily routine. Try a daily walk or other moderate aerobic exercise.  Manage stress. Find healthy ways to cope with the stressors, such as delegating tasks on your to-do list.  Practice relaxation techniques. Try deep breathing, yoga, massage and visualization.  Eat regularly. Eating regularly scheduled meals and maintaining a healthy diet might help prevent headaches. Also, drink plenty of fluids.  Follow a regular sleep schedule. Sleep deprivation might contribute to headaches Consider biofeedback. With this mind-body technique, you learn to control certain bodily functions - such as muscle tension, heart rate and blood pressure - to prevent headaches or reduce headache pain.    Proceed to emergency room if you experience new or worsening symptoms or symptoms do not resolve, if you have new neurologic symptoms or if headache is severe, or for any concerning symptom.   Provided education and documentation from American headache Society toolbox including articles on: chronic migraine medication overuse headache, chronic migraines, prevention of migraines, behavioral and other nonpharmacologic treatments for headache.    Naomie Dean, MD  [facility] [address] [place], [place] [address]  Phone [phone] Fax [phone]

## 2017-02-15 ENCOUNTER — Ambulatory Visit
Admission: RE | Admit: 2017-02-15 | Discharge: 2017-02-15 | Disposition: A | Discharge status: Home / Self Care | Payer: BLUE CROSS/BLUE SHIELD | Source: Ambulatory Visit | Attending: Family Medicine | Admitting: Family Medicine

## 2017-02-15 DIAGNOSIS — Z1231 Encounter for screening mammogram for malignant neoplasm of breast: Secondary | ICD-10-CM

## 2017-02-17 ENCOUNTER — Other Ambulatory Visit: Payer: Self-pay | Admitting: Family Medicine

## 2017-02-17 DIAGNOSIS — R928 Other abnormal and inconclusive findings on diagnostic imaging of breast: Secondary | ICD-10-CM

## 2017-02-22 ENCOUNTER — Other Ambulatory Visit: Payer: BLUE CROSS/BLUE SHIELD

## 2017-02-24 ENCOUNTER — Ambulatory Visit
Admission: RE | Admit: 2017-02-24 | Discharge: 2017-02-24 | Disposition: A | Discharge status: Home / Self Care | Payer: BLUE CROSS/BLUE SHIELD | Source: Ambulatory Visit | Attending: Family Medicine | Admitting: Family Medicine

## 2017-02-24 DIAGNOSIS — N6001 Solitary cyst of right breast: Secondary | ICD-10-CM | POA: Diagnosis not present

## 2017-02-24 DIAGNOSIS — R928 Other abnormal and inconclusive findings on diagnostic imaging of breast: Secondary | ICD-10-CM

## 2017-03-08 NOTE — Telephone Encounter (Signed)
Called BCBS prior authorization dept and spoke with Destiny. She stated that pt's pharmacy benefits are through [facility] [phone]. Transferred. Spoke with Inetta Fermoina, representative. She was unable to verify the patient.

## 2017-03-09 NOTE — Telephone Encounter (Addendum)
Spoke with [facility]. Staff stated that patient filled the Rizatriptan on January 17th. Called the plan and spoke with Destiny. Was told that this plan handles vision and dental but not medical and prescription. [facility] handles the authorization, [id number]. Transferred and spoke with Indiaarla. She stated the pt hasn't had coverage with them since 2001.   Called patient and informed her that the card on file from Jan 2019 is not for medical insurance. She stated that she does not have dental insurance and that her medical insurance is BCBS through [employer]. Confirmed ID number.   Called Provider line on back of card. Spoke with representative who stated that patient should have a separate card for pharmacy benefits. Spoke with patient and she did recall that she may have a card at home. She will call us back when she locates the card or at least finds out the pharmacy benefits manager from her employer.

## 2017-04-28 ENCOUNTER — Encounter: Payer: Self-pay | Admitting: Neurology

## 2017-05-03 ENCOUNTER — Ambulatory Visit: Payer: BLUE CROSS/BLUE SHIELD | Admitting: Neurology

## 2017-10-29 DIAGNOSIS — Z79899 Other long term (current) drug therapy: Secondary | ICD-10-CM | POA: Diagnosis not present

## 2017-10-29 DIAGNOSIS — M79602 Pain in left arm: Secondary | ICD-10-CM | POA: Diagnosis not present

## 2017-10-29 DIAGNOSIS — R531 Weakness: Secondary | ICD-10-CM | POA: Diagnosis not present

## 2017-10-29 DIAGNOSIS — R200 Anesthesia of skin: Secondary | ICD-10-CM | POA: Diagnosis not present

## 2017-10-29 DIAGNOSIS — R51 Headache: Secondary | ICD-10-CM | POA: Diagnosis not present

## 2017-10-29 DIAGNOSIS — Z5181 Encounter for therapeutic drug level monitoring: Secondary | ICD-10-CM | POA: Diagnosis not present

## 2017-10-29 DIAGNOSIS — R7303 Prediabetes: Secondary | ICD-10-CM | POA: Diagnosis not present

## 2017-10-30 DIAGNOSIS — R200 Anesthesia of skin: Secondary | ICD-10-CM | POA: Diagnosis not present

## 2017-11-04 DIAGNOSIS — M542 Cervicalgia: Secondary | ICD-10-CM | POA: Diagnosis not present

## 2017-11-04 DIAGNOSIS — M79602 Pain in left arm: Secondary | ICD-10-CM | POA: Diagnosis not present

## 2017-11-04 DIAGNOSIS — M47812 Spondylosis without myelopathy or radiculopathy, cervical region: Secondary | ICD-10-CM | POA: Diagnosis not present

## 2017-11-04 DIAGNOSIS — R200 Anesthesia of skin: Secondary | ICD-10-CM | POA: Diagnosis not present

## 2017-12-05 DIAGNOSIS — H00016 Hordeolum externum left eye, unspecified eyelid: Secondary | ICD-10-CM | POA: Diagnosis not present

## 2017-12-18 ENCOUNTER — Emergency Department (HOSPITAL_COMMUNITY)
Admission: EM | Admit: 2017-12-18 | Discharge: 2017-12-18 | Disposition: A | Discharge status: Home / Self Care | Payer: BLUE CROSS/BLUE SHIELD | Attending: Emergency Medicine | Admitting: Emergency Medicine

## 2017-12-18 ENCOUNTER — Emergency Department (HOSPITAL_COMMUNITY): Payer: BLUE CROSS/BLUE SHIELD

## 2017-12-18 ENCOUNTER — Encounter (HOSPITAL_COMMUNITY): Payer: Self-pay | Admitting: Emergency Medicine

## 2017-12-18 DIAGNOSIS — Z7984 Long term (current) use of oral hypoglycemic drugs: Secondary | ICD-10-CM | POA: Diagnosis not present

## 2017-12-18 DIAGNOSIS — R079 Chest pain, unspecified: Secondary | ICD-10-CM | POA: Diagnosis not present

## 2017-12-18 DIAGNOSIS — M62838 Other muscle spasm: Secondary | ICD-10-CM

## 2017-12-18 DIAGNOSIS — Z79899 Other long term (current) drug therapy: Secondary | ICD-10-CM | POA: Insufficient documentation

## 2017-12-18 DIAGNOSIS — M62830 Muscle spasm of back: Secondary | ICD-10-CM | POA: Diagnosis not present

## 2017-12-18 DIAGNOSIS — M549 Dorsalgia, unspecified: Secondary | ICD-10-CM | POA: Diagnosis present

## 2017-12-18 DIAGNOSIS — R0789 Other chest pain: Secondary | ICD-10-CM | POA: Insufficient documentation

## 2017-12-18 DIAGNOSIS — E119 Type 2 diabetes mellitus without complications: Secondary | ICD-10-CM | POA: Diagnosis not present

## 2017-12-18 LAB — CBC
HCT: 40.5 % (ref 36.0–46.0)
HEMOGLOBIN: 12.6 g/dL (ref 12.0–15.0)
MCH: 27.9 pg (ref 26.0–34.0)
MCHC: 31.1 g/dL (ref 30.0–36.0)
MCV: 89.8 fL (ref 80.0–100.0)
Platelets: 301 10*3/uL (ref 150–400)
RBC: 4.51 MIL/uL (ref 3.87–5.11)
RDW: 13.8 % (ref 11.5–15.5)
WBC: 7.4 10*3/uL (ref 4.0–10.5)
nRBC: 0 % (ref 0.0–0.2)

## 2017-12-18 LAB — I-STAT TROPONIN, ED: TROPONIN I, POC: 0.01 ng/mL (ref 0.00–0.08)

## 2017-12-18 LAB — BASIC METABOLIC PANEL
ANION GAP: 9 (ref 5–15)
BUN: 17 mg/dL (ref 6–20)
CHLORIDE: 106 mmol/L (ref 98–111)
CO2: 26 mmol/L (ref 22–32)
Calcium: 9 mg/dL (ref 8.9–10.3)
Creatinine, Ser: 0.81 mg/dL (ref 0.44–1.00)
GFR calc Af Amer: >60 mL/min (ref >60)
GLUCOSE: 91 mg/dL (ref 70–99)
POTASSIUM: 3.6 mmol/L (ref 3.5–5.1)
Sodium: 141 mmol/L (ref 135–145)

## 2017-12-18 LAB — I-STAT BETA HCG BLOOD, ED (MC, WL, AP ONLY): I-stat hCG, quantitative: <5 m[IU]/mL (ref <5)

## 2017-12-18 MED ORDER — CYCLOBENZAPRINE HCL 10 MG PO TABS: 10.0000 mg | ORAL_TABLET | Freq: Three times a day (TID) | ORAL | 0 refills | Status: DC | PRN

## 2017-12-18 MED ORDER — IBUPROFEN 800 MG PO TABS: 800.0000 mg | ORAL_TABLET | Freq: Three times a day (TID) | ORAL | 0 refills | Status: AC | PRN

## 2017-12-18 MED ORDER — IBUPROFEN 800 MG PO TABS
800.0000 mg | ORAL_TABLET | Freq: Once | ORAL | Status: AC
  Administered 2017-12-18: 800 mg via ORAL
  Filled 2017-12-18: qty 1

## 2017-12-18 NOTE — ED Triage Notes (Signed)
Patient here from home with complaints of back pain and chest pain. Reports chest pain radiating into back that started 4 days ago. Denies n/v.

## 2017-12-18 NOTE — ED Provider Notes (Signed)
[facility]-EMERGENCY DEPT Provider Note   CSN: [id number] Arrival date & time: 12/18/17  1319     History   Chief Complaint Chief Complaint  Patient presents with  . Back Pain  . Chest Pain    HPI Anna [name] is a 52 y.o. female.  The history is provided by the patient.  Back Pain   This is a new problem. The current episode started more than 2 days ago. The problem occurs daily. Progression since onset: waxing and waning  The pain is associated with no known injury. The pain is present in the thoracic spine. The quality of the pain is described as aching. Radiates to: left side of chest. The pain is at a severity of 2/10. The pain is mild. The symptoms are aggravated by twisting and certain positions. Associated symptoms include chest pain (left chest wall pain ). Pertinent negatives include no fever, no numbness, no weight loss, no headaches, no abdominal pain, no abdominal swelling, no bowel incontinence, no perianal numbness, no bladder incontinence, no dysuria, no pelvic pain, no leg pain, no paresthesias, no paresis, no tingling and no weakness. She has tried muscle relaxants for the symptoms. The treatment provided significant relief.    Past Medical History:  Diagnosis Date  . Diabetes mellitus without complication (HCC)   . Hypercholesteremia   . Migraine   . Muscle spasm     Patient Active Problem List   Diagnosis Date Noted  . Acute intractable headache 09/22/2016  . Chronic migraine 01/28/2016  . Neck muscle spasm 01/28/2016  . Chest pain 12/19/2015  . Diabetes mellitus type 2, controlled (HCC) 12/19/2015  . Hyperlipidemia 12/19/2015    Past Surgical History:  Procedure Laterality Date  . ABDOMINAL HYSTERECTOMY    . CARDIAC CATHETERIZATION N/A 12/19/2015   Procedure: Left Heart Cath and Coronary Angiography;  Surgeon: Lennette Biharihomas A Kelly, MD;  [facility];  Service: Cardiovascular;  Laterality: N/A;  . MYOMECTOMY     x2    . UTERINE FIBROID SURGERY       OB History   None      Home Medications    Prior to Admission medications   Medication Sig Start Date End Date Taking? Authorizing Provider  atorvastatin (LIPITOR) 20 MG tablet Take 1 tablet (20 mg total) by mouth daily at 6 PM. 02/02/17   Anson FretAhern, Antonia B, MD  cyclobenzaprine (FLEXERIL) 10 MG tablet Take 1 tablet (10 mg total) by mouth 3 (three) times daily as needed for up to 20 doses for muscle spasms. 12/18/17   Elizabella Nolet, DO  Diclofenac Potassium (CAMBIA) 50 MG PACK Take 50 mg by mouth as needed. Patient not taking: Reported on 02/02/2017 09/22/16   Levert FeinsteinYan, Yijun, MD  gabapentin (NEURONTIN) 300 MG capsule Take 1 capsule (300 mg total) by mouth 3 (three) times daily. 02/02/17 02/02/18  Anson FretAhern, Antonia B, MD  ibuprofen (ADVIL,MOTRIN) 800 MG tablet Take 1 tablet (800 mg total) by mouth every 8 (eight) hours as needed. 12/18/17 01/17/18  Nakkia Mackiewicz, DO  metFORMIN (GLUCOPHAGE-XR) 500 MG 24 hr tablet Take by mouth. 04/26/13   [provider]  metoprolol tartrate (LOPRESSOR) 25 MG tablet Take 0.5 tablets (12.5 mg total) by mouth 2 (two) times daily. 02/02/17   Anson FretAhern, Antonia B, MD  ondansetron (ZOFRAN ODT) 4 MG disintegrating tablet Take 1 tablet (4 mg total) by mouth every 8 (eight) hours as needed. 02/02/17   Anson FretAhern, Antonia B, MD  rizatriptan (MAXALT-MLT) 10 MG disintegrating  tablet Take 1 tablet (10 mg total) by mouth as needed. May repeat in 2 hours if needed 02/02/17   Anson Fret, MD  tiZANidine (ZANAFLEX) 4 MG tablet Take 1 tablet (4 mg total) by mouth every 6 (six) hours as needed for muscle spasms. Patient not taking: Reported on 02/02/2017 09/22/16   Levert Feinstein, MD    Family History Family History  Problem Relation Age of Onset  . CAD Mother 23  . Heart attack Mother   . CAD Father 49  . Heart attack Father   . Lupus Sister   . Breast cancer Sister     Social History Social History   Tobacco Use  . Smoking status: Never Smoker  .  Smokeless tobacco: Never Used  Substance Use Topics  . Alcohol use: No  . Drug use: No     Allergies   Gadolinium derivatives   Review of Systems Review of Systems  Constitutional: Negative for chills, fever and weight loss.  HENT: Negative for ear pain and sore throat.   Eyes: Negative for pain and visual disturbance.  Respiratory: Negative for cough and shortness of breath.   Cardiovascular: Positive for chest pain (left chest wall pain ). Negative for palpitations.  Gastrointestinal: Negative for abdominal pain, bowel incontinence and vomiting.  Genitourinary: Negative for bladder incontinence, dysuria, hematuria and pelvic pain.  Musculoskeletal: Positive for back pain. Negative for arthralgias.  Skin: Negative for color change and rash.  Neurological: Negative for tingling, seizures, syncope, weakness, numbness, headaches and paresthesias.  All other systems reviewed and are negative.    Physical Exam Updated Vital Signs  ED Triage Vitals  Enc Vitals Group     BP 12/18/17 1328 (!) 165/97     Pulse Rate 12/18/17 1328 83     Resp 12/18/17 1328 16     Temp 12/18/17 1328 98.8 F (37.1 C)     Temp Source 12/18/17 1328 Oral     SpO2 12/18/17 1328 99 %     Weight 12/18/17 1328 207 lb (93.9 kg)     Height 12/18/17 1328 5\' 6"  (1.676 m)     Head Circumference --      Peak Flow --      Pain Score 12/18/17 1336 6     Pain Loc --      Pain Edu? --      Excl. in GC? --     Physical Exam  Constitutional: She appears well-developed and well-nourished. No distress.  HENT:  Head: Normocephalic and atraumatic.  Eyes: Pupils are equal, round, and reactive to light. Conjunctivae and EOM are normal.  Neck: Normal range of motion. Neck supple.  Cardiovascular: Normal rate, regular rhythm, intact distal pulses and normal pulses.  No murmur heard. Pulmonary/Chest: Effort normal and breath sounds normal. No respiratory distress. She has no decreased breath sounds. She has no  wheezes. She has no rhonchi. She has no rales.  Abdominal: Soft. There is no tenderness.  Musculoskeletal: Normal range of motion. She exhibits no edema.       Right lower leg: She exhibits no edema.       Left lower leg: She exhibits no edema.  Patient tender in the upper thoracic paraspinal muscles, no midline spinal tenderness, tender in the left scapular area as well with tenderness along the anterior chest wall.  Neurological: She is alert.  Skin: Skin is warm and dry. Capillary refill takes less than 2 seconds. No rash noted.  Psychiatric: She has a  normal mood and affect.  Nursing note and vitals reviewed.    ED Treatments / Results  Labs (all labs ordered are listed, but only abnormal results are displayed) Labs Reviewed  BASIC METABOLIC PANEL  CBC  I-STAT TROPONIN, ED  I-STAT BETA HCG BLOOD, ED ([facility], [facility], [facility] ONLY)    EKG EKG Interpretation  Date/Time:  "Sunday December 18 2017 13:52:27 EST Ventricular Rate:  73 PR Interval:    QRS Duration: 88 QT Interval:  384 QTC Calculation: 424 R Axis:   44 Text Interpretation:  Sinus rhythm No significant change was found Confirmed by Furious Chiarelli, Deby Adger ([id number]) on 12/18/2017 3:10:39 PM   Radiology Dg Chest 2 View  Result Date: 12/18/2017 CLINICAL DATA:  Left-sided chest pain radiating to upper back 4 days. EXAM: CHEST - 2 VIEW COMPARISON:  12/18/2015 FINDINGS: Lungs are adequately inflated without focal airspace consolidation or effusion. Cardiomediastinal silhouette and remainder of the exam is unchanged. IMPRESSION: No active cardiopulmonary disease. Electronically Signed   By: Daniel  Boyle M.D.   On: 12/18/2017 14:44    Procedures Procedures (including critical care time)  Medications Ordered in ED Medications  ibuprofen (ADVIL,MOTRIN) tablet 800 mg (800 mg Oral Given 12/18/17 1512)     Initial Impression / Assessment and Plan / ED Course  I have reviewed the triage vital signs and the nursing notes.  Pertinent labs &  imaging results that were available during my care of the patient were reviewed by me and considered in my medical decision making (see chart for details).     Aaminah Scheaffer is a 52 year old female history of high cholesterol, diabetes who presents to the ED with upper back pain.  Patient with normal vitals.  No fever.  Patient with pain for the last several days.  No known injury.  She states that she took her daughter's muscle relaxant yesterday and pain got completely better.  She is mostly tender in the upper paraspinal muscles in the thoracic region as well as her anterior chest wall.  States that it is worse when she moves her arm.  Patient is Wells criteria 0 and doubt PE.  Patient with EKG that shows sinus rhythm.  No signs of ischemic changes.  Troponin within normal limits.  Doubt ACS given history and physical.  Patient with chest x-ray that showed no signs of pneumonia, pneumothorax, pleural effusion.  No significant anemia, electrolyte abnormality, kidney injury.  Patient with history and physical most consistent with a muscle spasm.  She is tender around the scapular region and worse when she raises her arm up.  There is no rash, no concern for zoster.  Patient given Motrin and Flexeril and discharged from ED in good condition.  Given return precautions.  Recommend follow-up with primary care doctor.  This chart was dictated using voice recognition software.  Despite best efforts to proofread,  errors can occur which can change the documentation meaning.   Final Clinical Impressions(s) / ED Diagnoses   Final diagnoses:  Muscle spasm    ED Discharge Orders         Ordered    cyclobenzaprine (FLEXERIL) 10 MG tablet  3 times daily PRN     12/18/17 1508    ibuprofen (ADVIL,MOTRIN) 800 MG tablet  Every 8 hours PRN     12" /01/19 1508           Breslyn Abdo, DO 12/18/17 1518

## 2018-01-03 DIAGNOSIS — H0015 Chalazion left lower eyelid: Secondary | ICD-10-CM | POA: Diagnosis not present

## 2018-01-05 DIAGNOSIS — H0014 Chalazion left upper eyelid: Secondary | ICD-10-CM | POA: Diagnosis not present

## 2018-01-06 DIAGNOSIS — M542 Cervicalgia: Secondary | ICD-10-CM | POA: Diagnosis not present

## 2018-01-06 DIAGNOSIS — G43019 Migraine without aura, intractable, without status migrainosus: Secondary | ICD-10-CM | POA: Diagnosis not present

## 2018-01-06 DIAGNOSIS — R51 Headache: Secondary | ICD-10-CM | POA: Diagnosis not present

## 2018-01-06 DIAGNOSIS — M5030 Other cervical disc degeneration, unspecified cervical region: Secondary | ICD-10-CM | POA: Diagnosis not present

## 2018-01-06 DIAGNOSIS — M79602 Pain in left arm: Secondary | ICD-10-CM | POA: Diagnosis not present

## 2018-01-06 DIAGNOSIS — R200 Anesthesia of skin: Secondary | ICD-10-CM | POA: Diagnosis not present

## 2018-02-03 DIAGNOSIS — G5612 Other lesions of median nerve, left upper limb: Secondary | ICD-10-CM | POA: Diagnosis not present

## 2018-02-27 ENCOUNTER — Other Ambulatory Visit: Payer: Self-pay | Admitting: Family Medicine

## 2018-02-27 DIAGNOSIS — Z1231 Encounter for screening mammogram for malignant neoplasm of breast: Secondary | ICD-10-CM

## 2018-03-20 ENCOUNTER — Ambulatory Visit
Admission: RE | Admit: 2018-03-20 | Discharge: 2018-03-20 | Disposition: A | Discharge status: Home / Self Care | Payer: BLUE CROSS/BLUE SHIELD | Source: Ambulatory Visit | Attending: Family Medicine | Admitting: Family Medicine

## 2018-03-20 DIAGNOSIS — Z1231 Encounter for screening mammogram for malignant neoplasm of breast: Secondary | ICD-10-CM

## 2018-04-04 DIAGNOSIS — E119 Type 2 diabetes mellitus without complications: Secondary | ICD-10-CM | POA: Diagnosis not present

## 2018-04-04 DIAGNOSIS — E7800 Pure hypercholesterolemia, unspecified: Secondary | ICD-10-CM | POA: Diagnosis not present

## 2018-04-04 DIAGNOSIS — I10 Essential (primary) hypertension: Secondary | ICD-10-CM | POA: Diagnosis not present

## 2018-04-18 DIAGNOSIS — A084 Viral intestinal infection, unspecified: Secondary | ICD-10-CM | POA: Diagnosis not present

## 2018-06-06 DIAGNOSIS — R110 Nausea: Secondary | ICD-10-CM | POA: Diagnosis not present

## 2018-06-06 DIAGNOSIS — K2970 Gastritis, unspecified, without bleeding: Secondary | ICD-10-CM | POA: Diagnosis not present

## 2018-06-06 DIAGNOSIS — R109 Unspecified abdominal pain: Secondary | ICD-10-CM | POA: Diagnosis not present

## 2018-07-12 ENCOUNTER — Ambulatory Visit
Admission: EM | Admit: 2018-07-12 | Discharge: 2018-07-12 | Disposition: A | Discharge status: Home / Self Care | Payer: BC Managed Care – PPO | Attending: Emergency Medicine | Admitting: Emergency Medicine

## 2018-07-12 ENCOUNTER — Other Ambulatory Visit: Payer: Self-pay

## 2018-07-12 DIAGNOSIS — R21 Rash and other nonspecific skin eruption: Secondary | ICD-10-CM

## 2018-07-12 MED ORDER — VALACYCLOVIR HCL 1 G PO TABS: 1000.0000 mg | ORAL_TABLET | Freq: Three times a day (TID) | ORAL | 0 refills | Status: AC

## 2018-07-12 NOTE — ED Provider Notes (Signed)
[facility]    CSN: [id number] Arrival date & time: 07/12/18  1824      History   Chief Complaint Chief Complaint  Patient presents with  . Insect Bite    HPI Anna [name] is a 53 y.o. female.   Anna [name] presents with complaints of rash to her back which is itching. Noted it 6/21 while at work. Felt itching initially. Now it feels itching and painful, burning and aching pain. Radiates around to lateral ribs. She had applied topical benadryl which only briefly helped. No fevers. No drainage from the area. States she has had shingles as a child. Some scabbing now. Denies any previous similar. Hx of dm, cardiac cath, hysterectomy.     ROS per HPI, negative if not otherwise mentioned.      Past Medical History:  Diagnosis Date  . Diabetes mellitus without complication ([place])   . Hypercholesteremia   . Migraine   . Muscle spasm     Patient Active Problem List   Diagnosis Date Noted  . Acute intractable headache 09/22/2016  . Chronic migraine 01/28/2016  . Neck muscle spasm 01/28/2016  . Chest pain 12/19/2015  . Diabetes mellitus type 2, controlled ([place]) 12/19/2015  . Hyperlipidemia 12/19/2015    Past Surgical History:  Procedure Laterality Date  . ABDOMINAL HYSTERECTOMY    . CARDIAC CATHETERIZATION N/A 12/19/2015   Procedure: Left Heart Cath and Coronary Angiography;  Surgeon: Troy Sine, MD;  Location: [facility];  Service: Cardiovascular;  Laterality: N/A;  . MYOMECTOMY     x2  . UTERINE FIBROID SURGERY      OB History   No obstetric history on file.      Home Medications    Prior to Admission medications   Medication Sig Start Date End Date Taking? Authorizing Provider  atorvastatin (LIPITOR) 20 MG tablet Take 1 tablet (20 mg total) by mouth daily at 6 PM. 02/02/17   Melvenia Beam, MD  cyclobenzaprine (FLEXERIL) 10 MG tablet Take 1 tablet (10 mg total) by mouth 3 (three) times daily as needed for up to 20 doses for  muscle spasms. 12/18/17   Curatolo, Adam, DO  Diclofenac Potassium (CAMBIA) 50 MG PACK Take 50 mg by mouth as needed. Patient not taking: Reported on 02/02/2017 09/22/16   Marcial Pacas, MD  gabapentin (NEURONTIN) 300 MG capsule Take 1 capsule (300 mg total) by mouth 3 (three) times daily. 02/02/17 02/02/18  Melvenia Beam, MD  metFORMIN (GLUCOPHAGE-XR) 500 MG 24 hr tablet Take by mouth. 04/26/13   [provider]  metoprolol tartrate (LOPRESSOR) 25 MG tablet Take 0.5 tablets (12.5 mg total) by mouth 2 (two) times daily. 02/02/17   Melvenia Beam, MD  ondansetron (ZOFRAN ODT) 4 MG disintegrating tablet Take 1 tablet (4 mg total) by mouth every 8 (eight) hours as needed. 02/02/17   Melvenia Beam, MD  rizatriptan (MAXALT-MLT) 10 MG disintegrating tablet Take 1 tablet (10 mg total) by mouth as needed. May repeat in 2 hours if needed 02/02/17   Melvenia Beam, MD  tiZANidine (ZANAFLEX) 4 MG tablet Take 1 tablet (4 mg total) by mouth every 6 (six) hours as needed for muscle spasms. Patient not taking: Reported on 02/02/2017 09/22/16   Marcial Pacas, MD  valACYclovir (VALTREX) 1000 MG tablet Take 1 tablet (1,000 mg total) by mouth 3 (three) times daily for 7 days. 07/12/18 07/19/18  Zigmund Gottron, NP    Family History Family History  Problem  Relation Age of Onset  . CAD Mother 3055  . Heart attack Mother   . CAD Father 7169  . Heart attack Father   . Lupus Sister   . Breast cancer Sister     Social History Social History   Tobacco Use  . Smoking status: Never Smoker  . Smokeless tobacco: Never Used  Substance Use Topics  . Alcohol use: No  . Drug use: No     Allergies   Gadolinium derivatives   Review of Systems Review of Systems   Physical Exam Triage Vital Signs ED Triage Vitals  Enc Vitals Group     BP 07/12/18 1832 (!) 144/90     Pulse Rate 07/12/18 1832 71     Resp 07/12/18 1832 16     Temp 07/12/18 1832 97.9 F (36.6 C)     Temp Source 07/12/18 1832 Oral     SpO2  07/12/18 1832 96 %     Weight --      Height --      Head Circumference --      Peak Flow --      Pain Score 07/12/18 1837 7     Pain Loc --      Pain Edu? --      Excl. in GC? --    No data found.  Updated Vital Signs BP (!) 144/90 (BP Location: Right Arm)   Pulse 71   Temp 97.9 F (36.6 C) (Oral)   Resp 16   SpO2 96%   Visual Acuity Right Eye Distance:   Left Eye Distance:   Bilateral Distance:    Right Eye Near:   Left Eye Near:    Bilateral Near:     Physical Exam Constitutional:      General: She is not in acute distress.    Appearance: She is well-developed.  Cardiovascular:     Rate and Rhythm: Normal rate.  Pulmonary:     Effort: Pulmonary effort is normal.  Skin:    General: Skin is warm and dry.          Comments: Cluster of papules, a few with scabs, no visible fluid or active drainage; no surrounding redness swelling or tenderness; complaints of pain lateral to lesions as well; doesn't cross midline   Neurological:     Mental Status: She is alert and oriented to person, place, and time.      UC Treatments / Results  Labs (all labs ordered are listed, but only abnormal results are displayed) Labs Reviewed - No data to display  EKG None  Radiology No results found.  Procedures Procedures (including critical care time)  Medications Ordered in UC Medications - No data to display  Initial Impression / Assessment and Plan / UC Course  I have reviewed the triage vital signs and the nursing notes.  Pertinent labs & imaging results that were available during my care of the patient were reviewed by me and considered in my medical decision making (see chart for details).     Rash appears more likely to be shingles, consistent with pain which she is experiencing as well. Valtrex provided and supportive cares discussed. Follow up with PCP as needed. Patient verbalized understanding and agreeable to plan.   Final Clinical Impressions(s) / UC  Diagnoses   Final diagnoses:  Rash     Discharge Instructions     I am concerned about shingles with this rash, therefore have started antiviral  medication for this.  You  may still apply cool compresses, topical hydrocortisone or benadryl cream as needed for itching or pain.  Tylenol or ibuprofen as needed.  If any worsening or persistent symptoms please return or go to the your primary care provider for recheck.    ED Prescriptions    Medication Sig Dispense Auth. Provider   valACYclovir (VALTREX) 1000 MG tablet Take 1 tablet (1,000 mg total) by mouth 3 (three) times daily for 7 days. 21 tablet Georgetta HaberBurky, Sergio Hobart B, NP     Controlled Substance Prescriptions [place] Controlled Substance Registry consulted? Not Applicable   Georgetta HaberBurky, Cailen Mihalik B, NP 07/12/18 [phone]

## 2018-07-12 NOTE — Discharge Instructions (Signed)
I am concerned about shingles with this rash, therefore have started antiviral  medication for this.  You may still apply cool compresses, topical hydrocortisone or benadryl cream as needed for itching or pain.  Tylenol or ibuprofen as needed.  If any worsening or persistent symptoms please return or go to the your primary care provider for recheck.

## 2018-07-12 NOTE — ED Triage Notes (Signed)
Pt c/o bug bite to mid back since Sunday, raised area noted

## 2018-07-13 DIAGNOSIS — E119 Type 2 diabetes mellitus without complications: Secondary | ICD-10-CM | POA: Diagnosis not present

## 2018-07-13 DIAGNOSIS — E7800 Pure hypercholesterolemia, unspecified: Secondary | ICD-10-CM | POA: Diagnosis not present

## 2018-07-17 DIAGNOSIS — R7303 Prediabetes: Secondary | ICD-10-CM | POA: Diagnosis not present

## 2018-07-17 DIAGNOSIS — E7800 Pure hypercholesterolemia, unspecified: Secondary | ICD-10-CM | POA: Diagnosis not present

## 2018-07-17 DIAGNOSIS — R1013 Epigastric pain: Secondary | ICD-10-CM | POA: Diagnosis not present

## 2018-07-17 DIAGNOSIS — I10 Essential (primary) hypertension: Secondary | ICD-10-CM | POA: Diagnosis not present

## 2018-09-13 DIAGNOSIS — Z20828 Contact with and (suspected) exposure to other viral communicable diseases: Secondary | ICD-10-CM | POA: Diagnosis not present

## 2018-09-17 ENCOUNTER — Other Ambulatory Visit: Payer: Self-pay

## 2018-09-17 DIAGNOSIS — G43809 Other migraine, not intractable, without status migrainosus: Secondary | ICD-10-CM | POA: Diagnosis not present

## 2018-09-17 DIAGNOSIS — E119 Type 2 diabetes mellitus without complications: Secondary | ICD-10-CM | POA: Insufficient documentation

## 2018-09-17 DIAGNOSIS — R0789 Other chest pain: Secondary | ICD-10-CM | POA: Insufficient documentation

## 2018-09-17 DIAGNOSIS — Z7984 Long term (current) use of oral hypoglycemic drugs: Secondary | ICD-10-CM | POA: Diagnosis not present

## 2018-09-17 DIAGNOSIS — R079 Chest pain, unspecified: Secondary | ICD-10-CM | POA: Diagnosis not present

## 2018-09-17 DIAGNOSIS — Z79899 Other long term (current) drug therapy: Secondary | ICD-10-CM | POA: Diagnosis not present

## 2018-09-18 ENCOUNTER — Emergency Department (HOSPITAL_COMMUNITY): Payer: BC Managed Care – PPO

## 2018-09-18 ENCOUNTER — Emergency Department (HOSPITAL_COMMUNITY)
Admission: EM | Admit: 2018-09-18 | Discharge: 2018-09-18 | Disposition: A | Discharge status: Home / Self Care | Payer: BC Managed Care – PPO | Attending: Emergency Medicine | Admitting: Emergency Medicine

## 2018-09-18 ENCOUNTER — Other Ambulatory Visit: Payer: Self-pay

## 2018-09-18 ENCOUNTER — Encounter (HOSPITAL_COMMUNITY): Payer: Self-pay

## 2018-09-18 DIAGNOSIS — G43809 Other migraine, not intractable, without status migrainosus: Secondary | ICD-10-CM

## 2018-09-18 DIAGNOSIS — R079 Chest pain, unspecified: Secondary | ICD-10-CM | POA: Diagnosis not present

## 2018-09-18 DIAGNOSIS — R0789 Other chest pain: Secondary | ICD-10-CM

## 2018-09-18 LAB — CBC
HCT: 39.2 % (ref 36.0–46.0)
Hemoglobin: 12.2 g/dL (ref 12.0–15.0)
MCH: 27.7 pg (ref 26.0–34.0)
MCHC: 31.1 g/dL (ref 30.0–36.0)
MCV: 89.1 fL (ref 80.0–100.0)
Platelets: 318 10*3/uL (ref 150–400)
RBC: 4.4 MIL/uL (ref 3.87–5.11)
RDW: 13.3 % (ref 11.5–15.5)
WBC: 8.2 10*3/uL (ref 4.0–10.5)
nRBC: 0 % (ref 0.0–0.2)

## 2018-09-18 LAB — TROPONIN I (HIGH SENSITIVITY)
Troponin I (High Sensitivity): 2 ng/L (ref <18)
Troponin I (High Sensitivity): 2 ng/L (ref <18)

## 2018-09-18 LAB — I-STAT BETA HCG BLOOD, ED (MC, WL, AP ONLY): I-stat hCG, quantitative: <5 m[IU]/mL (ref <5)

## 2018-09-18 LAB — BASIC METABOLIC PANEL
Anion gap: 12 (ref 5–15)
BUN: 20 mg/dL (ref 6–20)
CO2: 23 mmol/L (ref 22–32)
Calcium: 9.1 mg/dL (ref 8.9–10.3)
Chloride: 104 mmol/L (ref 98–111)
Creatinine, Ser: 0.9 mg/dL (ref 0.44–1.00)
GFR calc Af Amer: >60 mL/min (ref >60)
GFR calc non Af Amer: >60 mL/min (ref >60)
Glucose, Bld: 163 mg/dL — ABNORMAL HIGH (ref 70–99)
Potassium: 3.6 mmol/L (ref 3.5–5.1)
Sodium: 139 mmol/L (ref 135–145)

## 2018-09-18 MED ORDER — METOCLOPRAMIDE HCL 5 MG/ML IJ SOLN
10.0000 mg | Freq: Once | INTRAMUSCULAR | Status: AC
  Administered 2018-09-18: 10 mg via INTRAVENOUS
  Filled 2018-09-18: qty 2

## 2018-09-18 MED ORDER — KETOROLAC TROMETHAMINE 30 MG/ML IJ SOLN
30.0000 mg | Freq: Once | INTRAMUSCULAR | Status: AC
  Administered 2018-09-18: 30 mg via INTRAVENOUS
  Filled 2018-09-18: qty 1

## 2018-09-18 MED ORDER — SODIUM CHLORIDE 0.9 % IV BOLUS
1000.0000 mL | Freq: Once | INTRAVENOUS | Status: AC
  Administered 2018-09-18: 1000 mL via INTRAVENOUS

## 2018-09-18 MED ORDER — DIPHENHYDRAMINE HCL 50 MG/ML IJ SOLN
25.0000 mg | Freq: Once | INTRAMUSCULAR | Status: AC
  Administered 2018-09-18: 25 mg via INTRAVENOUS
  Filled 2018-09-18: qty 1

## 2018-09-18 MED ORDER — DEXAMETHASONE SODIUM PHOSPHATE 10 MG/ML IJ SOLN
10.0000 mg | Freq: Once | INTRAMUSCULAR | Status: AC
  Administered 2018-09-18: 10 mg via INTRAVENOUS
  Filled 2018-09-18: qty 1

## 2018-09-18 NOTE — ED Provider Notes (Signed)
[place] DEPT Provider Note   CSN: [id number] Arrival date & time: 09/17/18  2354     History   Chief Complaint Chief Complaint  Patient presents with  . Chest Pain  . Headache    HPI Anna [name] is a 53 y.o. female.     Has had a Migraine headache for one week. Sharp left sided pain and pressure. She reports partial relief with Extra Strength Excedrin. Has a history of migraines but hasn't had one "in a long time".  Complains of pressure in chest and back, centered around left chest. Has been on and off for one week. No alleviating or exacerbating factors. Does report a lot of stress at work. Cardiac risk factors are family history, obesity, HTN, hypercholesterolemia, diabetes.  Has had sweats, malaise, sore throat this week. Had negative COVID test two days ago.     Past Medical History:  Diagnosis Date  . Diabetes mellitus without complication ([place])   . Hypercholesteremia   . Migraine   . Muscle spasm     Patient Active Problem List   Diagnosis Date Noted  . Acute intractable headache 09/22/2016  . Chronic migraine 01/28/2016  . Neck muscle spasm 01/28/2016  . Chest pain 12/19/2015  . Diabetes mellitus type 2, controlled ([place]) 12/19/2015  . Hyperlipidemia 12/19/2015    Past Surgical History:  Procedure Laterality Date  . ABDOMINAL HYSTERECTOMY    . CARDIAC CATHETERIZATION N/A 12/19/2015   Procedure: Left Heart Cath and Coronary Angiography;  Surgeon: Troy Sine, MD;  Location: [facility];  Service: Cardiovascular;  Laterality: N/A;  . MYOMECTOMY     x2  . UTERINE FIBROID SURGERY       OB History   No obstetric history on file.      Home Medications    Prior to Admission medications   Medication Sig Start Date End Date Taking? Authorizing Provider  atorvastatin (LIPITOR) 20 MG tablet Take 1 tablet (20 mg total) by mouth daily at 6 PM. 02/02/17   Melvenia Beam, MD  cyclobenzaprine (FLEXERIL) 10 MG  tablet Take 1 tablet (10 mg total) by mouth 3 (three) times daily as needed for up to 20 doses for muscle spasms. 12/18/17   Curatolo, Adam, DO  Diclofenac Potassium (CAMBIA) 50 MG PACK Take 50 mg by mouth as needed. Patient not taking: Reported on 02/02/2017 09/22/16   Marcial Pacas, MD  gabapentin (NEURONTIN) 300 MG capsule Take 1 capsule (300 mg total) by mouth 3 (three) times daily. 02/02/17 02/02/18  Melvenia Beam, MD  metFORMIN (GLUCOPHAGE-XR) 500 MG 24 hr tablet Take by mouth. 04/26/13   [provider]  metoprolol tartrate (LOPRESSOR) 25 MG tablet Take 0.5 tablets (12.5 mg total) by mouth 2 (two) times daily. 02/02/17   Melvenia Beam, MD  ondansetron (ZOFRAN ODT) 4 MG disintegrating tablet Take 1 tablet (4 mg total) by mouth every 8 (eight) hours as needed. 02/02/17   Melvenia Beam, MD  rizatriptan (MAXALT-MLT) 10 MG disintegrating tablet Take 1 tablet (10 mg total) by mouth as needed. May repeat in 2 hours if needed 02/02/17   Melvenia Beam, MD  tiZANidine (ZANAFLEX) 4 MG tablet Take 1 tablet (4 mg total) by mouth every 6 (six) hours as needed for muscle spasms. Patient not taking: Reported on 02/02/2017 09/22/16   Marcial Pacas, MD    Family History Family History  Problem Relation Age of Onset  . CAD Mother 72  . Heart attack  Mother   . CAD Father 4769  . Heart attack Father   . Lupus Sister   . Breast cancer Sister     Social History Social History   Tobacco Use  . Smoking status: Never Smoker  . Smokeless tobacco: Never Used  Substance Use Topics  . Alcohol use: No  . Drug use: No     Allergies   Gadolinium derivatives   Review of Systems Review of Systems  Constitutional: Positive for diaphoresis.  HENT: Positive for sore throat.   Cardiovascular: Positive for chest pain.  Neurological: Positive for headaches.  All other systems reviewed and are negative.    Physical Exam Updated Vital Signs BP 138/81   Pulse 65   Temp 98.2 F (36.8 C) (Oral)   Resp  16   Ht 5\' 6"  (1.676 m)   Wt 93.4 kg   SpO2 100%   BMI 33.25 kg/m   Physical Exam Vitals signs and nursing note reviewed.  Constitutional:      General: She is not in acute distress.    Appearance: Normal appearance. She is well-developed.  HENT:     Head: Normocephalic and atraumatic.     Right Ear: Hearing normal.     Left Ear: Hearing normal.     Nose: Nose normal.  Eyes:     Conjunctiva/sclera: Conjunctivae normal.     Pupils: Pupils are equal, round, and reactive to light.  Neck:     Musculoskeletal: Normal range of motion and neck supple.  Cardiovascular:     Rate and Rhythm: Regular rhythm.     Heart sounds: S1 normal and S2 normal. No murmur. No friction rub. No gallop.   Pulmonary:     Effort: Pulmonary effort is normal. No respiratory distress.     Breath sounds: Normal breath sounds.  Chest:     Chest wall: No tenderness.  Abdominal:     General: Bowel sounds are normal.     Palpations: Abdomen is soft.     Tenderness: There is no abdominal tenderness. There is no guarding or rebound. Negative signs include Murphy's sign and McBurney's sign.     Hernia: No hernia is present.  Musculoskeletal: Normal range of motion.  Skin:    General: Skin is warm and dry.     Findings: No rash.  Neurological:     Mental Status: She is alert and oriented to person, place, and time.     GCS: GCS eye subscore is 4. GCS verbal subscore is 5. GCS motor subscore is 6.     Cranial Nerves: No cranial nerve deficit.     Sensory: No sensory deficit.     Coordination: Coordination normal.  Psychiatric:        Speech: Speech normal.        Behavior: Behavior normal.        Thought Content: Thought content normal.      ED Treatments / Results  Labs (all labs ordered are listed, but only abnormal results are displayed) Labs Reviewed  BASIC METABOLIC PANEL - Abnormal; Notable for the following components:      Result Value   Glucose, Bld 163 (*)    All other components within  normal limits  CBC  I-STAT BETA HCG BLOOD, ED ([facility], [facility], [facility] ONLY)  TROPONIN I (HIGH SENSITIVITY)  TROPONIN I (HIGH SENSITIVITY)    EKG EKG Interpretation  Date/Time:  Monday September 18 2018 00:05:36 EDT Ventricular Rate:  72 PR Interval:    QRS  Duration: 80 QT Interval:  383 QTC Calculation: 420 R Axis:   59 Text Interpretation:  Sinus rhythm Consider left atrial enlargement Low voltage, precordial leads Baseline wander in lead(s) V6 No significant change since last tracing Confirmed by Gilda Crease [phone]) on 09/18/2018 12:11:03 AM   Radiology Dg Chest 2 View  Result Date: 09/18/2018 CLINICAL DATA:  Initial evaluation for acute chest pain. EXAM: CHEST - 2 VIEW COMPARISON:  Prior radiograph from 12/18/2017. FINDINGS: The cardiac and mediastinal silhouettes are stable in size and contour, and remain within normal limits. The lungs are normally inflated. No airspace consolidation, pleural effusion, or pulmonary edema is identified. There is no pneumothorax. No acute osseous abnormality identified. IMPRESSION: No active cardiopulmonary disease. Electronically Signed   By: Rise Mu M.D.   On: 09/18/2018 01:39    Procedures Procedures (including critical care time)  Medications Ordered in ED Medications  ketorolac (TORADOL) 30 MG/ML injection 30 mg (30 mg Intravenous Given 09/18/18 0306)  metoCLOPramide (REGLAN) injection 10 mg (10 mg Intravenous Given 09/18/18 0307)  diphenhydrAMINE (BENADRYL) injection 25 mg (25 mg Intravenous Given 09/18/18 0305)  sodium chloride 0.9 % bolus 1,000 mL (1,000 mLs Intravenous New Bag/Given 09/18/18 0304)  dexamethasone (DECADRON) injection 10 mg (10 mg Intravenous Given 09/18/18 0308)     Initial Impression / Assessment and Plan / ED Course  I have reviewed the triage vital signs and the nursing notes.  Pertinent labs & imaging results that were available during my care of the patient were reviewed by me and considered in my medical  decision making (see chart for details).        Patient presents to the emergency department for multiple problems.  Patient complains of chest pain.  She has been having some intermittent pain in her chest for a week but pain worsened in the left side of her chest today with some tingling in the left arm.  Review of her records reveals that she did have a heart catheterization in 2017 that revealed completely normal coronary arteries.  Patient reports that she has under a lot of stress at work and this is likely causal for her chest pain.  Although she has multiple cardiac risk factors, recent normal catheterization and current work-up is very reassuring.  Patient also complaining of headache.  She has a history of recurrent migraines.  This is not the worst headache of her life.  She does not have any focal neurologic findings.  Patient treated with migraine cocktail with significant improvement.  No further work-up necessary.  Final Clinical Impressions(s) / ED Diagnoses   Final diagnoses:  Atypical chest pain  Other migraine without status migrainosus, not intractable    ED Discharge Orders    None       Pollina, Canary Brim, MD 09/18/18 [phone]

## 2018-09-18 NOTE — ED Triage Notes (Signed)
Pt reports pressure in her chest today. She says that she "has a lot going on." She states that she has had a migraine centered on the L side of her head for about a week, but today it moved down her neck and she started tingling in her L arm and experiencing the chest pressure. She also reports that her vision seems blurrier than usual.

## 2018-11-10 ENCOUNTER — Emergency Department (HOSPITAL_COMMUNITY): Payer: BC Managed Care – PPO

## 2018-11-10 ENCOUNTER — Other Ambulatory Visit: Payer: Self-pay

## 2018-11-10 ENCOUNTER — Encounter (HOSPITAL_COMMUNITY): Payer: Self-pay

## 2018-11-10 DIAGNOSIS — Z7984 Long term (current) use of oral hypoglycemic drugs: Secondary | ICD-10-CM | POA: Insufficient documentation

## 2018-11-10 DIAGNOSIS — R0789 Other chest pain: Secondary | ICD-10-CM | POA: Diagnosis not present

## 2018-11-10 DIAGNOSIS — E119 Type 2 diabetes mellitus without complications: Secondary | ICD-10-CM | POA: Insufficient documentation

## 2018-11-10 DIAGNOSIS — R079 Chest pain, unspecified: Secondary | ICD-10-CM | POA: Diagnosis not present

## 2018-11-10 DIAGNOSIS — R0602 Shortness of breath: Secondary | ICD-10-CM | POA: Diagnosis not present

## 2018-11-10 LAB — BASIC METABOLIC PANEL
Anion gap: 9 (ref 5–15)
BUN: 17 mg/dL (ref 6–20)
CO2: 25 mmol/L (ref 22–32)
Calcium: 8.9 mg/dL (ref 8.9–10.3)
Chloride: 105 mmol/L (ref 98–111)
Creatinine, Ser: 0.74 mg/dL (ref 0.44–1.00)
GFR calc Af Amer: >60 mL/min (ref >60)
GFR calc non Af Amer: >60 mL/min (ref >60)
Glucose, Bld: 97 mg/dL (ref 70–99)
Potassium: 3.5 mmol/L (ref 3.5–5.1)
Sodium: 139 mmol/L (ref 135–145)

## 2018-11-10 LAB — CBC
HCT: 38.6 % (ref 36.0–46.0)
Hemoglobin: 12.1 g/dL (ref 12.0–15.0)
MCH: 27.8 pg (ref 26.0–34.0)
MCHC: 31.3 g/dL (ref 30.0–36.0)
MCV: 88.5 fL (ref 80.0–100.0)
Platelets: 302 10*3/uL (ref 150–400)
RBC: 4.36 MIL/uL (ref 3.87–5.11)
RDW: 13.5 % (ref 11.5–15.5)
WBC: 9 10*3/uL (ref 4.0–10.5)
nRBC: 0 % (ref 0.0–0.2)

## 2018-11-10 LAB — I-STAT BETA HCG BLOOD, ED (NOT ORDERABLE): I-stat hCG, quantitative: <5 m[IU]/mL (ref <5)

## 2018-11-10 LAB — TROPONIN I (HIGH SENSITIVITY): Troponin I (High Sensitivity): 2 ng/L (ref <18)

## 2018-11-10 NOTE — ED Triage Notes (Signed)
Pt coming from home c/o left rib area pain that is radiating to the back with movement. Described as a knot feeling. Pain intermittent. Hx of shingles and nerve damage in C3-C4. Pain 8/10. Took Excedrin which helped slightly with pain.

## 2018-11-11 ENCOUNTER — Emergency Department (HOSPITAL_COMMUNITY)
Admission: EM | Admit: 2018-11-11 | Discharge: 2018-11-11 | Disposition: A | Discharge status: Home / Self Care | Payer: BC Managed Care – PPO | Attending: Emergency Medicine | Admitting: Emergency Medicine

## 2018-11-11 DIAGNOSIS — R0789 Other chest pain: Secondary | ICD-10-CM

## 2018-11-11 LAB — TROPONIN I (HIGH SENSITIVITY): Troponin I (High Sensitivity): 2 ng/L (ref <18)

## 2018-11-11 LAB — D-DIMER, QUANTITATIVE: D-Dimer, Quant: 0.36 ug/mL-FEU (ref 0.00–0.50)

## 2018-11-11 MED ORDER — LIDOCAINE 5 % EX PTCH
1.0000 | MEDICATED_PATCH | CUTANEOUS | Status: DC
  Administered 2018-11-11: 1 via TRANSDERMAL
  Filled 2018-11-11: qty 1

## 2018-11-11 MED ORDER — IBUPROFEN 800 MG PO TABS: 800.0000 mg | ORAL_TABLET | Freq: Three times a day (TID) | ORAL | 0 refills | Status: AC

## 2018-11-11 MED ORDER — ACETAMINOPHEN 500 MG PO TABS
1000.0000 mg | ORAL_TABLET | Freq: Once | ORAL | Status: AC
  Administered 2018-11-11: 1000 mg via ORAL
  Filled 2018-11-11: qty 2

## 2018-11-11 MED ORDER — NAPROXEN 500 MG PO TABS
500.0000 mg | ORAL_TABLET | ORAL | Status: AC
  Administered 2018-11-11: 500 mg via ORAL
  Filled 2018-11-11: qty 1

## 2018-11-11 NOTE — ED Provider Notes (Signed)
[facility]-EMERGENCY DEPT Provider Note   CSN: [id number] Arrival date & time: 11/10/18  2032     History   Chief Complaint Chief Complaint  Patient presents with  . Chest Pain    left rib area     HPI Anna [name] is a 53 y.o. female.     The history is provided by the patient.  Chest Pain Pain location:  L lateral chest Pain quality: dull   Pain radiates to:  Does not radiate Pain severity:  Moderate Onset quality:  Gradual Duration:  5 days Timing:  Constant Progression:  Unchanged Chronicity:  New Context: movement   Relieved by:  Nothing Worsened by:  Nothing Ineffective treatments:  None tried Associated symptoms: no abdominal pain, no AICD problem, no altered mental status, no anorexia, no anxiety, no back pain, no claudication, no cough, no diaphoresis, no dizziness, no dysphagia, no fatigue, no fever, no headache, no heartburn, no lower extremity edema, no nausea, no near-syncope, no numbness, no orthopnea, no palpitations, no PND, no shortness of breath, no syncope, no vomiting and no weakness   Risk factors: no aortic disease   5 days CP with movement denies injury.  No f/c/r. No cough.  No DOE.  No exertional symptoms.    Past Medical History:  Diagnosis Date  . Diabetes mellitus without complication (HCC)   . Hypercholesteremia   . Migraine   . Muscle spasm     Patient Active Problem List   Diagnosis Date Noted  . Acute intractable headache 09/22/2016  . Chronic migraine 01/28/2016  . Neck muscle spasm 01/28/2016  . Chest pain 12/19/2015  . Diabetes mellitus type 2, controlled (HCC) 12/19/2015  . Hyperlipidemia 12/19/2015    Past Surgical History:  Procedure Laterality Date  . ABDOMINAL HYSTERECTOMY    . CARDIAC CATHETERIZATION N/A 12/19/2015   Procedure: Left Heart Cath and Coronary Angiography;  Surgeon: Lennette Biharihomas A Kelly, MD;  Location: [facility] INV[facility]IVE [facility];  Service: Cardiovascular;  Laterality: N/A;  . MYOMECTOMY     x2  . UTERINE FIBROID SURGERY       OB History   No obstetric history on file.      Home Medications    Prior to Admission medications   Medication Sig Start Date End Date Taking? Authorizing Provider  atorvastatin (LIPITOR) 20 MG tablet Take 1 tablet (20 mg total) by mouth daily at 6 PM. 02/02/17  Yes Anson FretAhern, Antonia B, MD  cyclobenzaprine (FLEXERIL) 10 MG tablet Take 1 tablet (10 mg total) by mouth 3 (three) times daily as needed for up to 20 doses for muscle spasms. 12/18/17  Yes Curatolo, Adam, DO  gabapentin (NEURONTIN) 300 MG capsule Take 1 capsule (300 mg total) by mouth 3 (three) times daily. 02/02/17 11/11/18 Yes Anson FretAhern, Antonia B, MD  metFORMIN (GLUCOPHAGE-XR) 500 MG 24 hr tablet Take by mouth. 04/26/13  Yes [provider]  metoprolol tartrate (LOPRESSOR) 25 MG tablet Take 0.5 tablets (12.5 mg total) by mouth 2 (two) times daily. 02/02/17  Yes Anson FretAhern, Antonia B, MD  ondansetron (ZOFRAN ODT) 4 MG disintegrating tablet Take 1 tablet (4 mg total) by mouth every 8 (eight) hours as needed. 02/02/17  Yes Anson FretAhern, Antonia B, MD  rizatriptan (MAXALT-MLT) 10 MG disintegrating tablet Take 1 tablet (10 mg total) by mouth as needed. May repeat in 2 hours if needed 02/02/17  Yes Anson FretAhern, Antonia B, MD  tiZANidine (ZANAFLEX) 4 MG tablet Take 1 tablet (4 mg total) by mouth every 6 (six)  hours as needed for muscle spasms. 09/22/16  Yes Marcial Pacas, MD  Diclofenac Potassium (CAMBIA) 50 MG PACK Take 50 mg by mouth as needed. Patient not taking: Reported on 11/11/2018 09/22/16   Marcial Pacas, MD  ibuprofen (ADVIL) 800 MG tablet Take 1 tablet (800 mg total) by mouth 3 (three) times daily. 11/11/18   Treva Huyett, MD    Family History Family History  Problem Relation Age of Onset  . CAD Mother 23  . Heart attack Mother   . CAD Father 75  . Heart attack Father   . Lupus Sister   . Breast cancer Sister     Social History Social History   Tobacco Use  . Smoking status: Never Smoker  . Smokeless  tobacco: Never Used  Substance Use Topics  . Alcohol use: No  . Drug use: No     Allergies   Gadolinium derivatives   Review of Systems Review of Systems  Constitutional: Negative for diaphoresis, fatigue and fever.  HENT: Negative for trouble swallowing.   Respiratory: Negative for cough and shortness of breath.   Cardiovascular: Positive for chest pain. Negative for palpitations, orthopnea, claudication, syncope, PND and near-syncope.  Gastrointestinal: Negative for abdominal pain, anorexia, heartburn, nausea and vomiting.  Musculoskeletal: Negative for back pain.  Neurological: Negative for dizziness, weakness, numbness and headaches.  All other systems reviewed and are negative.    Physical Exam Updated Vital Signs BP (!) 143/130   Pulse 76   Temp 98.2 F (36.8 C) (Oral)   Resp 15   SpO2 100%   Physical Exam Vitals signs and nursing note reviewed.  Constitutional:      General: Anna [name] is not in acute distress.    Appearance: Anna [name] is normal weight.  HENT:     Head: Normocephalic and atraumatic.     Nose: Nose normal.     Mouth/Throat:     Mouth: Mucous membranes are moist.     Pharynx: Oropharynx is clear.  Eyes:     Conjunctiva/sclera: Conjunctivae normal.     Pupils: Pupils are equal, round, and reactive to light.  Neck:     Musculoskeletal: Normal range of motion and neck supple.  Cardiovascular:     Rate and Rhythm: Normal rate and regular rhythm.     Pulses: Normal pulses.     Heart sounds: Normal heart sounds.  Pulmonary:     Effort: Pulmonary effort is normal.     Breath sounds: Normal breath sounds.  Abdominal:     Tenderness: There is no abdominal tenderness. There is no guarding or rebound.  Musculoskeletal: Normal range of motion.  Skin:    General: Skin is warm and dry.     Capillary Refill: Capillary refill takes less than 2 seconds.  Neurological:     General: No focal deficit present.     Mental Status: Anna [name] is alert and oriented to person,  place, and time.     Deep Tendon Reflexes: Reflexes normal.  Psychiatric:        Mood and Affect: Mood normal.        Behavior: Behavior normal.      ED Treatments / Results  Labs (all labs ordered are listed, but only abnormal results are displayed) Results for orders placed or performed during the hospital encounter of 16/10/96  Basic metabolic panel  Result Value Ref Range   Sodium 139 135 - 145 mmol/L   Potassium 3.5 3.5 - 5.1 mmol/L   Chloride 105 98 - 111  mmol/L   CO2 25 22 - 32 mmol/L   Glucose, Bld 97 70 - 99 mg/dL   BUN 17 6 - 20 mg/dL   Creatinine, Ser 4.65 0.44 - 1.00 mg/dL   Calcium 8.9 8.9 - 68.1 mg/dL   GFR calc non Af Amer >60 >60 mL/min   GFR calc Af Amer >60 >60 mL/min   Anion gap 9 5 - 15  CBC  Result Value Ref Range   WBC 9.0 4.0 - 10.5 K/uL   RBC 4.36 3.87 - 5.11 MIL/uL   Hemoglobin 12.1 12.0 - 15.0 g/dL   HCT 27.5 17.0 - 01.7 %   MCV 88.5 80.0 - 100.0 fL   MCH 27.8 26.0 - 34.0 pg   MCHC 31.3 30.0 - 36.0 g/dL   RDW 49.4 49.6 - 75.9 %   Platelets 302 150 - 400 K/uL   nRBC 0.0 0.0 - 0.2 %  D-dimer, quantitative (not at [facility])  Result Value Ref Range   D-Dimer, Quant 0.36 0.00 - 0.50 ug/mL-FEU  I-Stat beta hCG blood, ED  Result Value Ref Range   I-stat hCG, quantitative <5.0 <5 mIU/mL   Comment 3          Troponin I (High Sensitivity)  Result Value Ref Range   Troponin I (High Sensitivity) 2 <18 ng/L  Troponin I (High Sensitivity)  Result Value Ref Range   Troponin I (High Sensitivity) 2 <18 ng/L   Dg Chest 2 View  Result Date: 11/10/2018 CLINICAL DATA:  Chest pain EXAM: CHEST - 2 VIEW COMPARISON:  09/18/2018 FINDINGS: The heart size and mediastinal contours are within normal limits. Both lungs are clear. The visualized skeletal structures are unremarkable. IMPRESSION: No acute abnormality of the lungs. Electronically Signed   By: Lauralyn Primes M.D.   On: 11/10/2018 21:13    EKG EKG Interpretation  Date/Time:  Friday November 10 2018 20:50:57  EDT Ventricular Rate:  77 PR Interval:    QRS Duration: 87 QT Interval:  372 QTC Calculation: 421 R Axis:   75 Text Interpretation:  Sinus rhythm Confirmed by Richie Bonanno ([id number]) on 11/11/2018 12:31:52 AM   Radiology Dg Chest 2 View  Result Date: 11/10/2018 CLINICAL DATA:  Chest pain EXAM: CHEST - 2 VIEW COMPARISON:  09/18/2018 FINDINGS: The heart size and mediastinal contours are within normal limits. Both lungs are clear. The visualized skeletal structures are unremarkable. IMPRESSION: No acute abnormality of the lungs. Electronically Signed   By: Lauralyn Primes M.D.   On: 11/10/2018 21:13    Procedures Procedures (including critical care time)  Medications Ordered in ED Medications  lidocaine (LIDODERM) 5 % 1 patch (1 patch Transdermal Patch Applied 11/11/18 0242)  acetaminophen (TYLENOL) tablet 1,000 mg (1,000 mg Oral Given 11/11/18 0241)  naproxen (NAPROSYN) tablet 500 mg (500 mg Oral Given 11/11/18 0241)     Heart score is one low risk for MACE.  Ruled out for PE based negative Ddimer.  Symptoms are highly atypical for cardiac etiology.  I highly doubt cardiac etiology as symptoms   Kingsley Herandez was evaluated in Emergency Department on 11/11/2018 for the symptoms described in the history of present illness. Anna [name] was evaluated in the context of the global COVID-19 pandemic, which necessitated consideration that the patient might be at risk for infection with the SARS-CoV-2 virus that causes COVID-19. Institutional protocols and algorithms that pertain to the evaluation of patients at risk for COVID-19 are in a state of rapid change based on information released by regulatory bodies  including the CDC and federal and state organizations. These policies and algorithms were followed during the patient's care in the ED.  Final Clinical Impressions(s) / ED Diagnoses   Final diagnoses:  Chest wall pain   Return for weakness, numbness, changes in vision or speech, fevers >100.4  unrelieved by medication, shortness of breath, intractable vomiting, or diarrhea, abdominal pain, Inability to tolerate liquids or food, cough, altered mental status or any concerns. No signs of systemic illness or infection. The patient is nontoxic-appearing on exam and vital signs are within normal limits.   I have reviewed the triage vital signs and the nursing notes. Pertinent labs &imaging results that were available during my care of the patient were reviewed by me and considered in my medical decision making (see chart for details).  After history, exam, and medical workup I feel the patient has been appropriately medically screened and is safe for discharge home. Pertinent diagnoses were discussed with the patient. Patient was given return precautions  ED Discharge Orders         Ordered    ibuprofen (ADVIL) 800 MG tablet  3 times daily     11/11/18 0403           Alan Drummer, MD 11/11/18 [phone]

## 2018-11-11 NOTE — ED Notes (Signed)
Pt was verbalized discharge instructions. Pt had no further questions at this time. NAD. 

## 2018-12-25 DIAGNOSIS — M542 Cervicalgia: Secondary | ICD-10-CM | POA: Diagnosis not present

## 2018-12-25 DIAGNOSIS — M5030 Other cervical disc degeneration, unspecified cervical region: Secondary | ICD-10-CM | POA: Diagnosis not present

## 2018-12-25 DIAGNOSIS — G5612 Other lesions of median nerve, left upper limb: Secondary | ICD-10-CM | POA: Diagnosis not present

## 2018-12-25 DIAGNOSIS — G8929 Other chronic pain: Secondary | ICD-10-CM | POA: Diagnosis not present

## 2018-12-25 DIAGNOSIS — M549 Dorsalgia, unspecified: Secondary | ICD-10-CM | POA: Diagnosis not present

## 2018-12-27 DIAGNOSIS — R200 Anesthesia of skin: Secondary | ICD-10-CM | POA: Diagnosis not present

## 2018-12-27 DIAGNOSIS — M542 Cervicalgia: Secondary | ICD-10-CM | POA: Diagnosis not present

## 2018-12-27 DIAGNOSIS — R202 Paresthesia of skin: Secondary | ICD-10-CM | POA: Diagnosis not present

## 2019-01-01 DIAGNOSIS — E7800 Pure hypercholesterolemia, unspecified: Secondary | ICD-10-CM | POA: Diagnosis not present

## 2019-01-01 DIAGNOSIS — I10 Essential (primary) hypertension: Secondary | ICD-10-CM | POA: Diagnosis not present

## 2019-01-01 DIAGNOSIS — E119 Type 2 diabetes mellitus without complications: Secondary | ICD-10-CM | POA: Diagnosis not present

## 2019-01-01 DIAGNOSIS — G43909 Migraine, unspecified, not intractable, without status migrainosus: Secondary | ICD-10-CM | POA: Diagnosis not present

## 2019-02-18 DIAGNOSIS — R072 Precordial pain: Secondary | ICD-10-CM | POA: Diagnosis not present

## 2019-02-18 DIAGNOSIS — G43709 Chronic migraine without aura, not intractable, without status migrainosus: Secondary | ICD-10-CM | POA: Diagnosis not present

## 2019-02-18 DIAGNOSIS — R0789 Other chest pain: Secondary | ICD-10-CM | POA: Diagnosis not present

## 2019-02-18 DIAGNOSIS — I10 Essential (primary) hypertension: Secondary | ICD-10-CM | POA: Diagnosis not present

## 2019-02-18 DIAGNOSIS — I517 Cardiomegaly: Secondary | ICD-10-CM | POA: Diagnosis not present

## 2019-02-18 DIAGNOSIS — G43909 Migraine, unspecified, not intractable, without status migrainosus: Secondary | ICD-10-CM | POA: Diagnosis not present

## 2019-02-18 DIAGNOSIS — Z20822 Contact with and (suspected) exposure to covid-19: Secondary | ICD-10-CM | POA: Diagnosis not present

## 2019-02-18 DIAGNOSIS — R519 Headache, unspecified: Secondary | ICD-10-CM | POA: Diagnosis not present

## 2019-02-18 DIAGNOSIS — R112 Nausea with vomiting, unspecified: Secondary | ICD-10-CM | POA: Diagnosis not present

## 2019-02-18 DIAGNOSIS — R079 Chest pain, unspecified: Secondary | ICD-10-CM | POA: Diagnosis not present

## 2019-03-22 ENCOUNTER — Emergency Department (HOSPITAL_COMMUNITY)
Admission: EM | Admit: 2019-03-22 | Discharge: 2019-03-22 | Disposition: A | Discharge status: Home / Self Care | Payer: BC Managed Care – PPO | Attending: Emergency Medicine | Admitting: Emergency Medicine

## 2019-03-22 ENCOUNTER — Emergency Department (HOSPITAL_COMMUNITY): Payer: BC Managed Care – PPO

## 2019-03-22 ENCOUNTER — Other Ambulatory Visit: Payer: Self-pay

## 2019-03-22 ENCOUNTER — Encounter (HOSPITAL_COMMUNITY): Payer: Self-pay

## 2019-03-22 DIAGNOSIS — Z79899 Other long term (current) drug therapy: Secondary | ICD-10-CM | POA: Diagnosis not present

## 2019-03-22 DIAGNOSIS — Z7984 Long term (current) use of oral hypoglycemic drugs: Secondary | ICD-10-CM | POA: Diagnosis not present

## 2019-03-22 DIAGNOSIS — E119 Type 2 diabetes mellitus without complications: Secondary | ICD-10-CM | POA: Insufficient documentation

## 2019-03-22 DIAGNOSIS — R0789 Other chest pain: Secondary | ICD-10-CM | POA: Diagnosis not present

## 2019-03-22 DIAGNOSIS — R079 Chest pain, unspecified: Secondary | ICD-10-CM | POA: Diagnosis not present

## 2019-03-22 LAB — D-DIMER, QUANTITATIVE: D-Dimer, Quant: <0.27 ug/mL-FEU (ref 0.00–0.50)

## 2019-03-22 LAB — TROPONIN I (HIGH SENSITIVITY)
Troponin I (High Sensitivity): <2 ng/L (ref <18)
Troponin I (High Sensitivity): <2 ng/L (ref <18)

## 2019-03-22 LAB — BASIC METABOLIC PANEL
Anion gap: 10 (ref 5–15)
BUN: 13 mg/dL (ref 6–20)
CO2: 26 mmol/L (ref 22–32)
Calcium: 9.2 mg/dL (ref 8.9–10.3)
Chloride: 106 mmol/L (ref 98–111)
Creatinine, Ser: 0.85 mg/dL (ref 0.44–1.00)
GFR calc Af Amer: >60 mL/min (ref >60)
GFR calc non Af Amer: >60 mL/min (ref >60)
Glucose, Bld: 111 mg/dL — ABNORMAL HIGH (ref 70–99)
Potassium: 4.1 mmol/L (ref 3.5–5.1)
Sodium: 142 mmol/L (ref 135–145)

## 2019-03-22 LAB — CBC
HCT: 40.3 % (ref 36.0–46.0)
Hemoglobin: 12.5 g/dL (ref 12.0–15.0)
MCH: 27.8 pg (ref 26.0–34.0)
MCHC: 31 g/dL (ref 30.0–36.0)
MCV: 89.6 fL (ref 80.0–100.0)
Platelets: 327 10*3/uL (ref 150–400)
RBC: 4.5 MIL/uL (ref 3.87–5.11)
RDW: 13.7 % (ref 11.5–15.5)
WBC: 7 10*3/uL (ref 4.0–10.5)
nRBC: 0 % (ref 0.0–0.2)

## 2019-03-22 LAB — I-STAT BETA HCG BLOOD, ED (MC, WL, AP ONLY): I-stat hCG, quantitative: <5 m[IU]/mL (ref <5)

## 2019-03-22 MED ORDER — KETOROLAC TROMETHAMINE 30 MG/ML IJ SOLN
30.0000 mg | Freq: Once | INTRAMUSCULAR | Status: AC
  Administered 2019-03-22: 30 mg via INTRAMUSCULAR
  Filled 2019-03-22: qty 1

## 2019-03-22 NOTE — ED Triage Notes (Signed)
Pt reports chest pain that started yesterday, no other symptoms. States she took ibuprofen last night without relief and then the pain woke her out of her sleep around 1am this morning. Pt a.o, resp e.u

## 2019-03-22 NOTE — ED Notes (Signed)
Pt transported to xray. Xray will transport pt to room after.

## 2019-03-22 NOTE — ED Provider Notes (Signed)
[facility] EMERGENCY DEPARTMENT Provider Note   CSN: [id number] Arrival date & time: 03/22/19  6606     History Chief Complaint  Patient presents with  . Chest Pain    Anna [name] is a 54 y.o. female.  Pt presents to the ED today with CP.  Pt said it started yesterday, but eased off after taking ibuprofen.  Pt said it felt like sharp, stabbing pain.  No sob.  No n/v.  No f/c.  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        Past Medical History:  Diagnosis Date  . Diabetes mellitus without complication ([place])   . Hypercholesteremia   . Migraine   . Muscle spasm     Patient Active Problem List   Diagnosis Date Noted  . Acute intractable headache 09/22/2016  . Chronic migraine 01/28/2016  . Neck muscle spasm 01/28/2016  . Chest pain 12/19/2015  . Diabetes mellitus type 2, controlled ([place]) 12/19/2015  . Hyperlipidemia 12/19/2015    Past Surgical History:  Procedure Laterality Date  . ABDOMINAL HYSTERECTOMY    . CARDIAC CATHETERIZATION N/A 12/19/2015   Procedure: Left Heart Cath and Coronary Angiography;  Surgeon: Troy Sine, MD;  Location: [facility] CV LAB;  Service: Cardiovascular;  Laterality: N/A;  . MYOMECTOMY     x2  . UTERINE FIBROID SURGERY       OB History   No obstetric history on file.     Family History  Problem Relation Age of Onset  . CAD Mother 70  . Heart attack Mother   . CAD Father 72  . Heart attack Father   . Lupus Sister   . Breast cancer Sister     Social History   Tobacco Use  . Smoking status: Never Smoker  . Smokeless tobacco: Never Used  Substance Use Topics  . Alcohol use: No  . Drug use: No    Home Medications Prior to Admission medications   Medication Sig Start Date End Date Taking? Authorizing Provider  Ascorbic Acid (VITAMIN C) 1000 MG tablet Take 500 mg by mouth daily.   Yes [provider]  atorvastatin (LIPITOR) 20 MG tablet  Take 1 tablet (20 mg total) by mouth daily at 6 PM. 02/02/17  Yes Melvenia Beam, MD  Cholecalciferol (VITAMIN D) 125 MCG (5000 UT) CAPS Take 5,000 Units by mouth every other day.   Yes [provider]  ELDERBERRY PO Take 1 capsule by mouth every other day.   Yes [provider]  ibuprofen (ADVIL) 800 MG tablet Take 1 tablet (800 mg total) by mouth 3 (three) times daily. 11/11/18  Yes Palumbo, April, MD  metoprolol tartrate (LOPRESSOR) 25 MG tablet Take 0.5 tablets (12.5 mg total) by mouth 2 (two) times daily. 02/02/17  Yes Melvenia Beam, MD  rizatriptan (MAXALT-MLT) 10 MG disintegrating tablet Take 1 tablet (10 mg total) by mouth as needed. May repeat in 2 hours if needed 02/02/17  Yes Melvenia Beam, MD  cyclobenzaprine (FLEXERIL) 10 MG tablet Take 1 tablet (10 mg total) by mouth 3 (three) times daily as needed for up to 20 doses for muscle spasms. Patient not taking: Reported on 03/22/2019 12/18/17   Lennice Sites, DO  Diclofenac Potassium (CAMBIA) 50 MG PACK Take 50 mg by mouth as needed. Patient not taking: Reported on 11/11/2018 09/22/16   Marcial Pacas, MD  gabapentin (NEURONTIN) 300 MG  capsule Take 1 capsule (300 mg total) by mouth 3 (three) times daily. 02/02/17 11/11/18  Anson Fret, MD  ondansetron (ZOFRAN ODT) 4 MG disintegrating tablet Take 1 tablet (4 mg total) by mouth every 8 (eight) hours as needed. Patient not taking: Reported on 03/22/2019 02/02/17   Anson Fret, MD  tiZANidine (ZANAFLEX) 4 MG tablet Take 1 tablet (4 mg total) by mouth every 6 (six) hours as needed for muscle spasms. Patient not taking: Reported on 03/22/2019 09/22/16   Levert Feinstein, MD    Allergies    Gadolinium derivatives  Review of Systems   Review of Systems  Cardiovascular: Positive for chest pain.  All other systems reviewed and are negative.   Physical Exam Updated Vital Signs BP (!) 147/89 (BP Location: Right Arm)   Pulse 67   Temp 98 F (36.7 C) (Oral)   Resp (!) 21   Ht 5'  6" (1.676 m)   Wt 90.7 kg   SpO2 99%   BMI 32.28 kg/m   Physical Exam Vitals and nursing note reviewed.  Constitutional:      Appearance: She is well-developed.  HENT:     Head: Normocephalic and atraumatic.  Eyes:     Extraocular Movements: Extraocular movements intact.     Pupils: Pupils are equal, round, and reactive to light.  Cardiovascular:     Rate and Rhythm: Normal rate and regular rhythm.     Heart sounds: Normal heart sounds.  Pulmonary:     Effort: Pulmonary effort is normal.     Breath sounds: Normal breath sounds.  Abdominal:     General: Bowel sounds are normal.     Palpations: Abdomen is soft.  Musculoskeletal:        General: Normal range of motion.     Cervical back: Normal range of motion and neck supple.  Skin:    General: Skin is warm and dry.     Capillary Refill: Capillary refill takes less than 2 seconds.  Neurological:     General: No focal deficit present.     Mental Status: She is alert and oriented to person, place, and time.  Psychiatric:        Mood and Affect: Mood normal.        Behavior: Behavior normal.     ED Results / Procedures / Treatments   Labs (all labs ordered are listed, but only abnormal results are displayed) Labs Reviewed  BASIC METABOLIC PANEL - Abnormal; Notable for the following components:      Result Value   Glucose, Bld 111 (*)    All other components within normal limits  CBC  D-DIMER, QUANTITATIVE (NOT AT [facility])  I-STAT BETA HCG BLOOD, ED ([facility], [facility], [facility] ONLY)  TROPONIN I (HIGH SENSITIVITY)  TROPONIN I (HIGH SENSITIVITY)    EKG EKG Interpretation  Date/Time:  Thursday March 22 2019 09:01:18 EST Ventricular Rate:  71 PR Interval:  162 QRS Duration: 72 QT Interval:  378 QTC Calculation: 410 R Axis:   86 Text Interpretation: Normal sinus rhythm Low voltage QRS Cannot rule out Anterior infarct , age undetermined Abnormal ECG No significant change since last tracing Confirmed by Jacalyn Lefevre [phone]) on  03/22/2019 9:39:14 AM   Radiology DG Chest 2 View  Result Date: 03/22/2019 CLINICAL DATA:  Chest pain. EXAM: CHEST - 2 VIEW COMPARISON:  November 10, 2018. FINDINGS: The heart size and mediastinal contours are within normal limits. Both lungs are clear. No pneumothorax or pleural effusion is noted.  The visualized skeletal structures are unremarkable. IMPRESSION: No active cardiopulmonary disease. Electronically Signed   By: Lupita Raider M.D.   On: 03/22/2019 09:46    Procedures Procedures (including critical care time)  Medications Ordered in ED Medications  ketorolac (TORADOL) 30 MG/ML injection 30 mg (30 mg Intramuscular Given 03/22/19 1112)    ED Course  I have reviewed the triage vital signs and the nursing notes.  Pertinent labs & imaging results that were available during my care of the patient were reviewed by me and considered in my medical decision making (see chart for details).    MDM Rules/Calculators/A&P                      Heart score of 2.  Pt is feeling better after toradol.  Nl ekg and 2 neg trop.  Neg ddimer.  Pt is stable for d/c.  Return if worse.  Final Clinical Impression(s) / ED Diagnoses Final diagnoses:  Atypical chest pain    Rx / DC Orders ED Discharge Orders    None       Jacalyn Lefevre, MD 03/22/19 1237

## 2019-03-22 NOTE — ED Notes (Signed)
Patient verbalizes understanding of discharge instructions. Opportunity for questioning and answers were provided. Armband removed by staff, pt discharged from ED.  

## 2019-03-26 DIAGNOSIS — S29011D Strain of muscle and tendon of front wall of thorax, subsequent encounter: Secondary | ICD-10-CM | POA: Diagnosis not present

## 2019-03-26 DIAGNOSIS — R0789 Other chest pain: Secondary | ICD-10-CM | POA: Diagnosis not present

## 2019-04-12 ENCOUNTER — Other Ambulatory Visit: Payer: Self-pay | Admitting: Family Medicine

## 2019-04-12 DIAGNOSIS — Z1231 Encounter for screening mammogram for malignant neoplasm of breast: Secondary | ICD-10-CM

## 2019-04-25 ENCOUNTER — Ambulatory Visit: Payer: BC Managed Care – PPO

## 2019-04-26 ENCOUNTER — Other Ambulatory Visit: Payer: Self-pay

## 2019-04-26 ENCOUNTER — Ambulatory Visit
Admission: RE | Admit: 2019-04-26 | Discharge: 2019-04-26 | Disposition: A | Discharge status: Home / Self Care | Payer: BC Managed Care – PPO | Source: Ambulatory Visit | Attending: Family Medicine | Admitting: Family Medicine

## 2019-04-26 DIAGNOSIS — Z1231 Encounter for screening mammogram for malignant neoplasm of breast: Secondary | ICD-10-CM | POA: Diagnosis not present

## 2019-04-30 ENCOUNTER — Other Ambulatory Visit: Payer: Self-pay | Admitting: Family Medicine

## 2019-04-30 DIAGNOSIS — R928 Other abnormal and inconclusive findings on diagnostic imaging of breast: Secondary | ICD-10-CM

## 2019-05-02 ENCOUNTER — Ambulatory Visit
Admission: RE | Admit: 2019-05-02 | Discharge: 2019-05-02 | Disposition: A | Discharge status: Home / Self Care | Payer: BC Managed Care – PPO | Source: Ambulatory Visit | Attending: Family Medicine | Admitting: Family Medicine

## 2019-05-02 ENCOUNTER — Other Ambulatory Visit: Payer: Self-pay

## 2019-05-02 ENCOUNTER — Other Ambulatory Visit: Payer: Self-pay | Admitting: Family Medicine

## 2019-05-02 DIAGNOSIS — R921 Mammographic calcification found on diagnostic imaging of breast: Secondary | ICD-10-CM | POA: Diagnosis not present

## 2019-05-02 DIAGNOSIS — R928 Other abnormal and inconclusive findings on diagnostic imaging of breast: Secondary | ICD-10-CM

## 2019-05-14 ENCOUNTER — Ambulatory Visit
Admission: RE | Admit: 2019-05-14 | Discharge: 2019-05-14 | Disposition: A | Discharge status: Home / Self Care | Payer: BC Managed Care – PPO | Source: Ambulatory Visit | Attending: Family Medicine | Admitting: Family Medicine

## 2019-05-14 ENCOUNTER — Other Ambulatory Visit: Payer: Self-pay

## 2019-05-14 ENCOUNTER — Other Ambulatory Visit: Payer: Self-pay | Admitting: Family Medicine

## 2019-05-14 DIAGNOSIS — R928 Other abnormal and inconclusive findings on diagnostic imaging of breast: Secondary | ICD-10-CM

## 2019-05-14 DIAGNOSIS — R921 Mammographic calcification found on diagnostic imaging of breast: Secondary | ICD-10-CM | POA: Diagnosis not present

## 2019-05-14 DIAGNOSIS — N6012 Diffuse cystic mastopathy of left breast: Secondary | ICD-10-CM | POA: Diagnosis not present

## 2019-07-30 DIAGNOSIS — G43909 Migraine, unspecified, not intractable, without status migrainosus: Secondary | ICD-10-CM | POA: Diagnosis not present

## 2019-07-30 DIAGNOSIS — E7800 Pure hypercholesterolemia, unspecified: Secondary | ICD-10-CM | POA: Diagnosis not present

## 2019-07-30 DIAGNOSIS — R7303 Prediabetes: Secondary | ICD-10-CM | POA: Diagnosis not present

## 2019-07-30 DIAGNOSIS — I10 Essential (primary) hypertension: Secondary | ICD-10-CM | POA: Diagnosis not present

## 2019-08-06 ENCOUNTER — Emergency Department (HOSPITAL_COMMUNITY): Payer: BC Managed Care – PPO

## 2019-08-06 ENCOUNTER — Other Ambulatory Visit: Payer: Self-pay

## 2019-08-06 ENCOUNTER — Encounter (HOSPITAL_COMMUNITY): Payer: Self-pay | Admitting: Emergency Medicine

## 2019-08-06 ENCOUNTER — Emergency Department (HOSPITAL_COMMUNITY)
Admission: EM | Admit: 2019-08-06 | Discharge: 2019-08-07 | Disposition: A | Discharge status: Home / Self Care | Payer: BC Managed Care – PPO | Attending: Emergency Medicine | Admitting: Emergency Medicine

## 2019-08-06 DIAGNOSIS — R0789 Other chest pain: Secondary | ICD-10-CM | POA: Insufficient documentation

## 2019-08-06 DIAGNOSIS — Z79899 Other long term (current) drug therapy: Secondary | ICD-10-CM | POA: Insufficient documentation

## 2019-08-06 DIAGNOSIS — E119 Type 2 diabetes mellitus without complications: Secondary | ICD-10-CM | POA: Insufficient documentation

## 2019-08-06 DIAGNOSIS — R079 Chest pain, unspecified: Secondary | ICD-10-CM | POA: Diagnosis not present

## 2019-08-06 LAB — BASIC METABOLIC PANEL
Anion gap: 10 (ref 5–15)
BUN: 13 mg/dL (ref 6–20)
CO2: 24 mmol/L (ref 22–32)
Calcium: 9.1 mg/dL (ref 8.9–10.3)
Chloride: 107 mmol/L (ref 98–111)
Creatinine, Ser: 1.09 mg/dL — ABNORMAL HIGH (ref 0.44–1.00)
GFR calc Af Amer: >60 mL/min (ref >60)
GFR calc non Af Amer: 57 mL/min — ABNORMAL LOW (ref >60)
Glucose, Bld: 84 mg/dL (ref 70–99)
Potassium: 3.6 mmol/L (ref 3.5–5.1)
Sodium: 141 mmol/L (ref 135–145)

## 2019-08-06 LAB — CBC
HCT: 39.5 % (ref 36.0–46.0)
Hemoglobin: 12.6 g/dL (ref 12.0–15.0)
MCH: 27.3 pg (ref 26.0–34.0)
MCHC: 31.9 g/dL (ref 30.0–36.0)
MCV: 85.5 fL (ref 80.0–100.0)
Platelets: 330 10*3/uL (ref 150–400)
RBC: 4.62 MIL/uL (ref 3.87–5.11)
RDW: 13.5 % (ref 11.5–15.5)
WBC: 7.4 10*3/uL (ref 4.0–10.5)
nRBC: 0 % (ref 0.0–0.2)

## 2019-08-06 LAB — I-STAT BETA HCG BLOOD, ED (MC, WL, AP ONLY): I-stat hCG, quantitative: <5 m[IU]/mL

## 2019-08-06 LAB — TROPONIN I (HIGH SENSITIVITY)
Troponin I (High Sensitivity): 4 ng/L
Troponin I (High Sensitivity): 5 ng/L

## 2019-08-06 MED ORDER — SODIUM CHLORIDE 0.9% FLUSH: 3.0000 mL | Freq: Once | INTRAVENOUS | Status: DC

## 2019-08-06 NOTE — ED Triage Notes (Signed)
Pt c/o left side cp and HTN since last Friday, No n/v or SOB.

## 2019-08-07 MED ORDER — ALUM & MAG HYDROXIDE-SIMETH 200-200-20 MG/5ML PO SUSP
30.0000 mL | Freq: Once | ORAL | Status: AC
  Administered 2019-08-07: 30 mL via ORAL
  Filled 2019-08-07: qty 30

## 2019-08-07 MED ORDER — LIDOCAINE VISCOUS HCL 2 % MT SOLN
15.0000 mL | Freq: Once | OROMUCOSAL | Status: AC
  Administered 2019-08-07: 15 mL via ORAL
  Filled 2019-08-07: qty 15

## 2019-08-07 NOTE — Discharge Instructions (Addendum)
Your kidney function was slightly elevated today as we discussed.  I recommend you increase your water intake and have your kidney function rechecked by your primary care doctor when you follow-up.  Also recommend you follow up with your neurologist as we discussed.

## 2019-08-07 NOTE — ED Notes (Signed)
Patient verbalizes understanding of discharge instructions. Opportunity for questioning and answers were provided. Armband removed by staff, pt discharged from ED. Pt. ambulatory and discharged home.  

## 2019-08-07 NOTE — ED Provider Notes (Signed)
[facility] EMERGENCY DEPARTMENT Provider Note   CSN: [id number] Arrival date & time: 08/06/19  1434     History Chief Complaint  Patient presents with  . Chest Pain    Anna [name] is a 54 y.o. female with past medical history significant for diabetes, hyperlipidemia, migraine.    HPI Patient presents to emergency department today with chief complaint of intermittent left-sided chest pain x4 days.  She describes the pain as a sharp and stabbing sensation.  Pain does not radiate.  She rates the pain 5 out of 10 in severity.  She tried taking ibuprofen without any symptom relief.  There are no alleviating or exacerbating factors. She is also endorsing left-sided facial and arm tingling.  She states this is also intermittent.  She has a history of migraines and states this feels the exact same.  She denies any tingling symptoms currently. She has been checking her blood pressure daily and reports systolic readings in the 160s. She saw her pcp and no medications were changed.  Denies fever chills, visual changes, neck pain or stiffness, dyspnea on exertion, SOB, pleuritic chest pain, syncope, chest tightness or pressure, radiation to left/right arm, jaw or back, nausea, or diaphoresis. Patient's cardiac risk factors include family history, obesity, hypertension, hyperlipidemia and diabetes.  Chart review shows patient had cardiac cath in 2017 with normal LV EF and normal coronary arteries. With recommendation of a noncardiac etiology for her chest pain.    Past Medical History:  Diagnosis Date  . Diabetes mellitus without complication (HCC)   . Hypercholesteremia   . Migraine   . Muscle spasm     Patient Active Problem List   Diagnosis Date Noted  . Acute intractable headache 09/22/2016  . Chronic migraine 01/28/2016  . Neck muscle spasm 01/28/2016  . Chest pain 12/19/2015  . Diabetes mellitus type 2, controlled (HCC) 12/19/2015  . Hyperlipidemia 12/19/2015     Past Surgical History:  Procedure Laterality Date  . ABDOMINAL HYSTERECTOMY    . CARDIAC CATHETERIZATION N/A 12/19/2015   Procedure: Left Heart Cath and Coronary Angiography;  Surgeon: Lennette Bihari, MD;  Location: [facility] INVASIVE CV LAB;  Service: Cardiovascular;  Laterality: N/A;  . MYOMECTOMY     x2  . UTERINE FIBROID SURGERY       OB History   No obstetric history on file.     Family History  Problem Relation Age of Onset  . CAD Mother 28  . Heart attack Mother   . CAD Father 2  . Heart attack Father   . Lupus Sister   . Breast cancer Sister     Social History   Tobacco Use  . Smoking status: Never Smoker  . Smokeless tobacco: Never Used  Vaping Use  . Vaping Use: Never used  Substance Use Topics  . Alcohol use: No  . Drug use: No    Home Medications Prior to Admission medications   Medication Sig Start Date End Date Taking? Authorizing Provider  acetaminophen (TYLENOL) 500 MG tablet Take 1,000 mg by mouth daily as needed for headache.   Yes [provider]  Ascorbic Acid (VITAMIN C) 1000 MG tablet Take 500 mg by mouth daily.   Yes [provider]  atorvastatin (LIPITOR) 20 MG tablet Take 1 tablet (20 mg total) by mouth daily at 6 PM. 02/02/17  Yes Anson Fret, MD  Cholecalciferol (VITAMIN D) 125 MCG (5000 UT) CAPS Take 5,000 Units by mouth every other day.  Yes [provider]  ibuprofen (ADVIL) 800 MG tablet Take 1 tablet (800 mg total) by mouth 3 (three) times daily. Patient taking differently: Take 800 mg by mouth daily as needed (pain).  11/11/18  Yes Palumbo, April, MD  metoprolol succinate (TOPROL-XL) 25 MG 24 hr tablet Take 25 mg by mouth at bedtime. 06/08/19  Yes [provider]  rizatriptan (MAXALT-MLT) 10 MG disintegrating tablet Take 1 tablet (10 mg total) by mouth as needed. May repeat in 2 hours if needed Patient taking differently: Take 10 mg by mouth See admin instructions. Take one tablet (10 mg) by mouth  daily as needed for migraine, may repeat in 2 hours if still needed. 02/02/17  Yes Anson Fret, MD    Allergies    Gadolinium derivatives  Review of Systems   Review of Systems All other systems are reviewed and are negative for acute change except as noted in the HPI.  Physical Exam Updated Vital Signs BP (!) 142/76 (BP Location: Right Arm)   Pulse 67   Temp 98.3 F (36.8 C)   Resp 12   Ht 5\' 6"  (1.676 m)   Wt 92.5 kg   SpO2 99%   BMI 32.93 kg/m   Physical Exam Vitals and nursing note reviewed.  Constitutional:      General: She is not in acute distress.    Appearance: She is not ill-appearing.  HENT:     Head: Normocephalic and atraumatic.     Comments: No sinus or temporal tenderness.    Right Ear: Tympanic membrane and external ear normal.     Left Ear: Tympanic membrane and external ear normal.     Nose: Nose normal.     Mouth/Throat:     Mouth: Mucous membranes are moist.     Pharynx: Oropharynx is clear.  Eyes:     General: No scleral icterus.       Right eye: No discharge.        Left eye: No discharge.     Extraocular Movements: Extraocular movements intact.     Conjunctiva/sclera: Conjunctivae normal.     Pupils: Pupils are equal, round, and reactive to light.  Neck:     Vascular: No JVD.     Comments: No meningeal signs Cardiovascular:     Rate and Rhythm: Normal rate and regular rhythm.     Pulses: Normal pulses.          Radial pulses are 2+ on the right side and 2+ on the left side.     Heart sounds: Normal heart sounds. No murmur heard.   Pulmonary:     Comments: Lungs clear to auscultation in all fields. Symmetric chest rise. No wheezing, rales, or rhonchi. Abdominal:     Comments: Abdomen is soft, non-distended, and non-tender in all quadrants. No rigidity, no guarding. No peritoneal signs.  Musculoskeletal:        General: Normal range of motion.     Cervical back: Normal range of motion.  Skin:    General: Skin is warm and dry.      Capillary Refill: Capillary refill takes less than 2 seconds.  Neurological:     Mental Status: She is oriented to person, place, and time.     GCS: GCS eye subscore is 4. GCS verbal subscore is 5. GCS motor subscore is 6.     Comments: Mental Status:  Alert, oriented, thought content appropriate, able to give a coherent history. Speech fluent without evidence of aphasia.  Able to follow 2 step commands without difficulty.  Cranial Nerves:  II:  Peripheral visual fields grossly normal, pupils equal, round, reactive to light III,IV, VI: ptosis not present, extra-ocular motions intact bilaterally  V,VII: smile symmetric, facial light touch sensation equal VIII: hearing grossly normal to voice  X: uvula elevates symmetrically  XI: bilateral shoulder shrug symmetric and strong XII: midline tongue extension without fassiculations Motor:  Normal tone. 5/5 in upper and lower extremities bilaterally including strong and equal grip strength and dorsiflexion/plantar flexion Sensory: Pinprick and light touch normal in all extremities.  Deep Tendon Reflexes: 2+ and symmetric in the biceps and patella Cerebellar: normal finger-to-nose with bilateral upper extremities Gait: normal gait and balance CV: distal pulses palpable throughout   Psychiatric:        Behavior: Behavior normal.     ED Results / Procedures / Treatments   Labs (all labs ordered are listed, but only abnormal results are displayed) Labs Reviewed  BASIC METABOLIC PANEL - Abnormal; Notable for the following components:      Result Value   Creatinine, Ser 1.09 (*)    GFR calc non Af Amer 57 (*)    All other components within normal limits  CBC  I-STAT BETA HCG BLOOD, ED ([facility], [facility], [facility] ONLY)  TROPONIN I (HIGH SENSITIVITY)  TROPONIN I (HIGH SENSITIVITY)    EKG Date/Time: 06-August-2019    14:48:19 Ventricular Rate: 75 PR interval: 154 QRS Duration: 78  QT Interval:  376 QTC Calculation: 419 R axis: 96 Text Interpretation:  Normal sinus rhythm.  Rightward axis  Radiology DG Chest 2 View  Result Date: 08/06/2019 CLINICAL DATA:  Chest pain EXAM: CHEST - 2 VIEW COMPARISON:  03/22/2019 FINDINGS: The heart size and mediastinal contours are within normal limits. Both lungs are clear. The visualized skeletal structures are unremarkable. IMPRESSION: No active cardiopulmonary disease. Electronically Signed   By: Alcide Clever M.D.   On: 08/06/2019 16:02    Procedures Procedures (including critical care time)  Medications Ordered in ED Medications  sodium chloride flush (NS) 0.9 % injection 3 mL (has no administration in time range)  alum & mag hydroxide-simeth (MAALOX/MYLANTA) 200-200-20 MG/5ML suspension 30 mL (30 mLs Oral Given 08/07/19 0041)    And  lidocaine (XYLOCAINE) 2 % viscous mouth solution 15 mL (15 mLs Oral Given 08/07/19 0041)    ED Course  I have reviewed the triage vital signs and the nursing notes.  Pertinent labs & imaging results that were available during my care of the patient were reviewed by me and considered in my medical decision making (see chart for details).  Vitals:   08/06/19 2127 08/06/19 2355 08/07/19 0045 08/07/19 0100  BP: (!) 168/83 (!) 142/76  (!) 147/96  Pulse: 88 67 73 74  Resp: 17 12 18  (!) 23  Temp: 98.3 F (36.8 C)     TempSrc:      SpO2: 100% 99% 99% 100%  Weight:      Height:           MDM Rules/Calculators/A&P                          History provided by patient with additional history obtained from chart review. Chart review shows patient has had multiple visits for left-sided chest pain and left-sided numbness and tingling.   54 yo female presenting with left sided chest pain and facial tingling. She states the tingling has resolved prior to my evaluation. On  exam she is well-appearing.  She has a normal neuro exam, no focal weakness.  It is reassuring today that her symptoms are the exact same and exam is benign.  Labs were collected in triage.  I viewed  results.  There are no signs of end organ damage.  CBC, BMP overall unremarkable.  Creatinine slightly elevated at 1.09.  It appears her baseline is around 0.8.  Delta troponin flat x2. I viewed pt's chest xray and it does not suggest acute infectious processes. EKG without ischemic changes. Heart score of 3. Deltra troponin flat. Presentation not consistent with ACS or PE. Engaged in shared decision making regarding CT imaging given her complaint of left-sided facial tingling and hand numbness.  Her symptoms have completely resolved and feel the exact same as they have in the past with her migraines.  Patient would like to hold off on imaging at this time.  I offered headache cocktail as well and patient states because she is asymptomatic she does not need headache cocktail at this time.  She does agree to try GI cocktail for her chest pain. On reassessment her chest pain has improved.  She is eager to be discharged home.  Her vital signs here have been stable.  Recommend that she follow-up with PCP and neurology to further discuss chest pain and migraines.  The patient appears reasonably screened and/or stabilized for discharge and I doubt any other medical condition or other [facility] requiring further screening, evaluation, or treatment in the ED at this time prior to discharge. The patient is safe for discharge with strict return precautions discussed.  Patient appears reliable for follow-up.   Portions of this note were generated with Scientist, clinical (histocompatibility and immunogenetics)Dragon dictation software. Dictation errors may occur despite best attempts at proofreading.   Final Clinical Impression(s) / ED Diagnoses Final diagnoses:  Atypical chest pain    Rx / DC Orders ED Discharge Orders    None       Kathyrn Lasslbrizze, Alechia Lezama E, PA-C 08/07/19 0147    Mesner, Barbara CowerJason, MD 08/07/19 [id number]

## 2019-09-28 DIAGNOSIS — Z8249 Family history of ischemic heart disease and other diseases of the circulatory system: Secondary | ICD-10-CM | POA: Diagnosis not present

## 2019-09-28 DIAGNOSIS — R0789 Other chest pain: Secondary | ICD-10-CM | POA: Diagnosis not present

## 2019-09-28 DIAGNOSIS — R079 Chest pain, unspecified: Secondary | ICD-10-CM | POA: Diagnosis not present

## 2019-09-28 DIAGNOSIS — R200 Anesthesia of skin: Secondary | ICD-10-CM | POA: Diagnosis not present

## 2019-09-28 DIAGNOSIS — I10 Essential (primary) hypertension: Secondary | ICD-10-CM | POA: Diagnosis not present

## 2021-03-08 IMAGING — MG MM BREAST BX W LOC DEV 1ST LESION IMAGE BX SPEC STEREO GUIDE*L*
8 of 10 series · 8 of 18 positions shown · non-contrast
Comparison: Previous exams.
COMPARISON: Previous exams.

Addendum:
CLINICAL DATA: 54-year-old with a screening detected 3 mm group of
new indeterminate calcifications involving the LOWER LEFT breast at
MIDDLE depth, slight LOWER OUTER QUADRANT. Family history of breast
cancer in her sister at age 50.

EXAM:
LEFT BREAST STEREOTACTIC CORE NEEDLE BIOPSY

[L (1 of 3)]
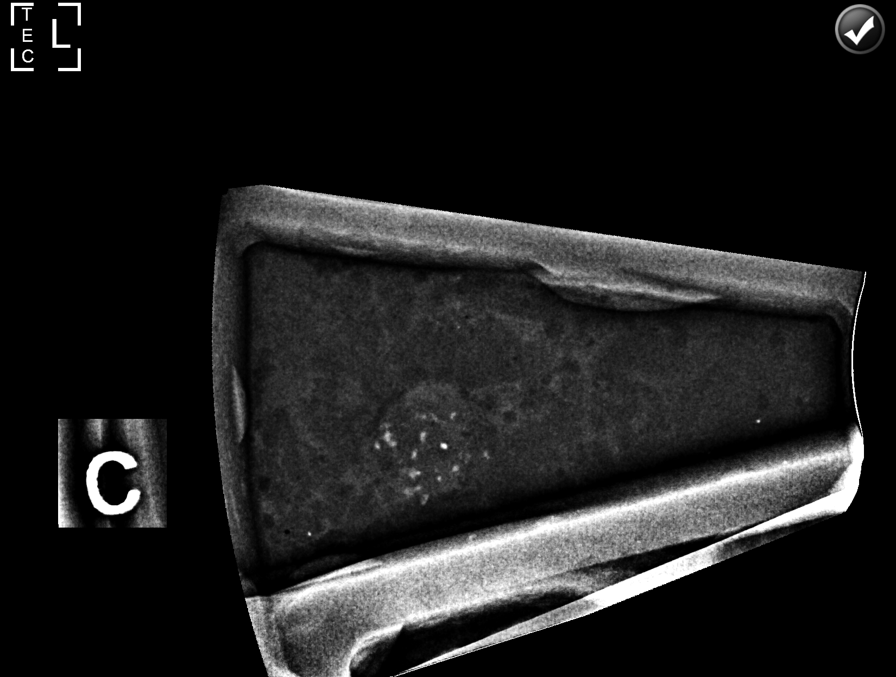

[L (2 of 3)]
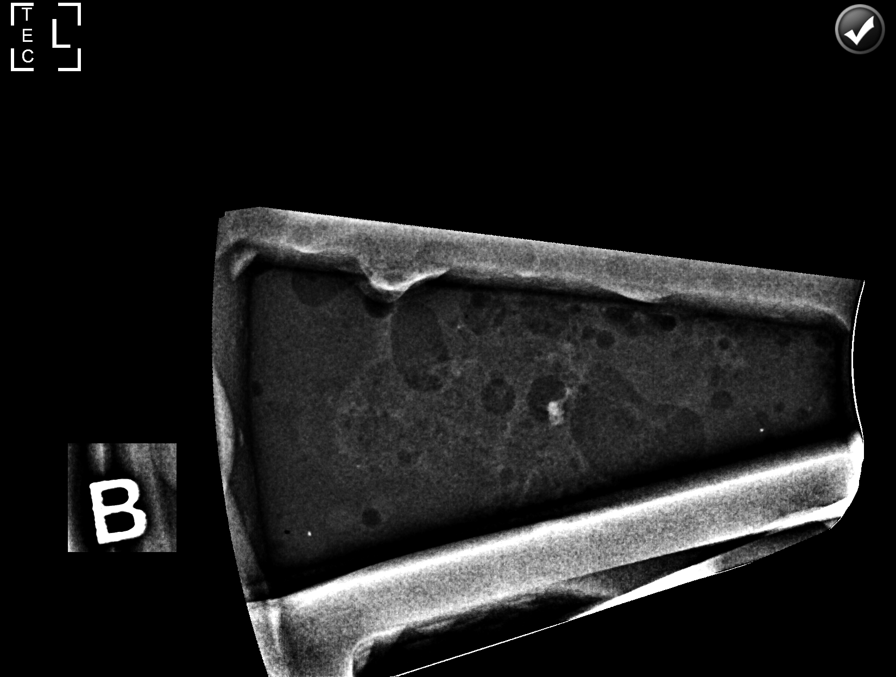

[L (3 of 3)]
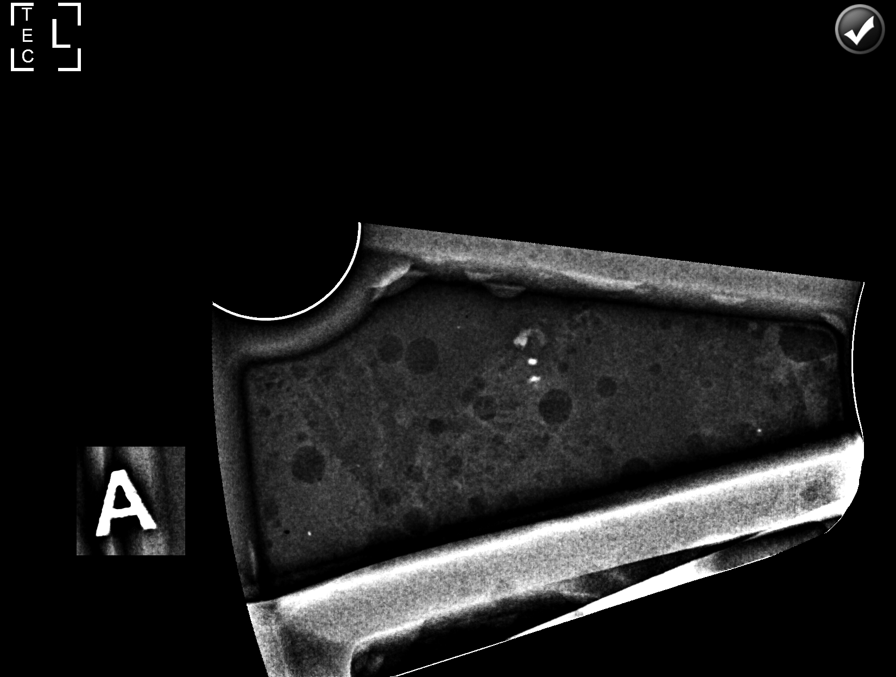

[L FB (1 of 5)]
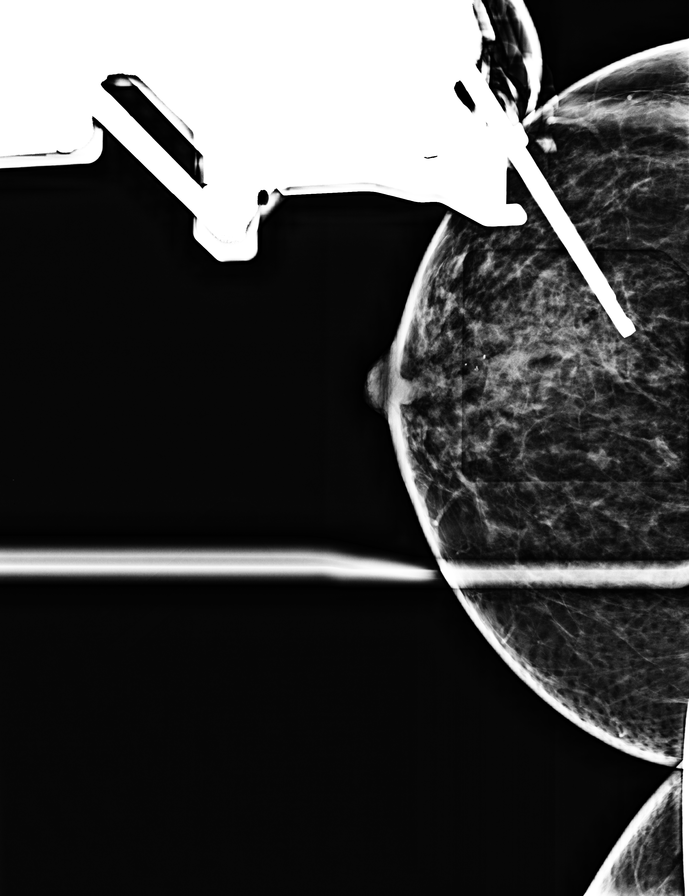

[L FB (2 of 5)]
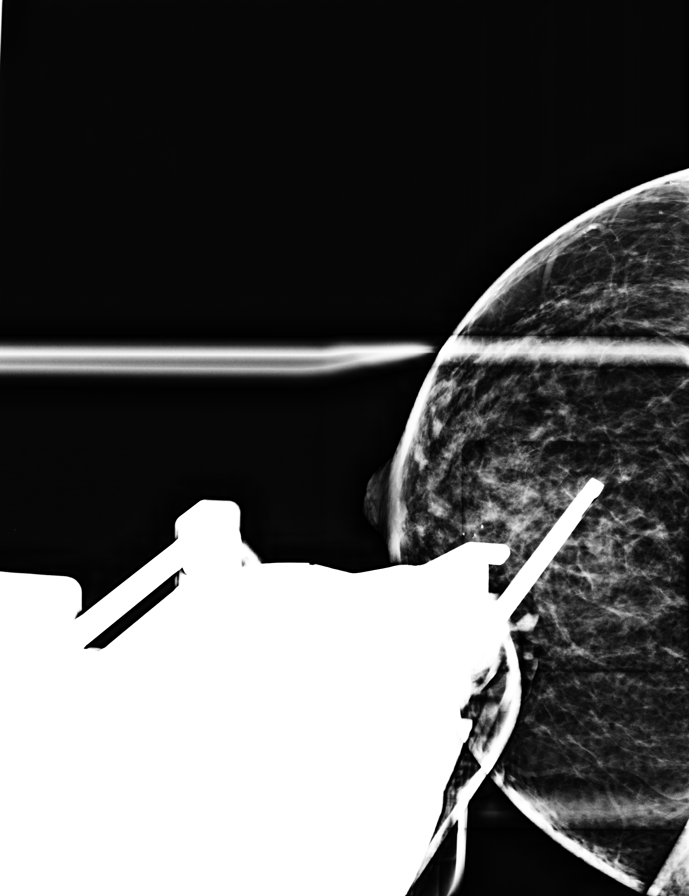

[L FB (3 of 5)]
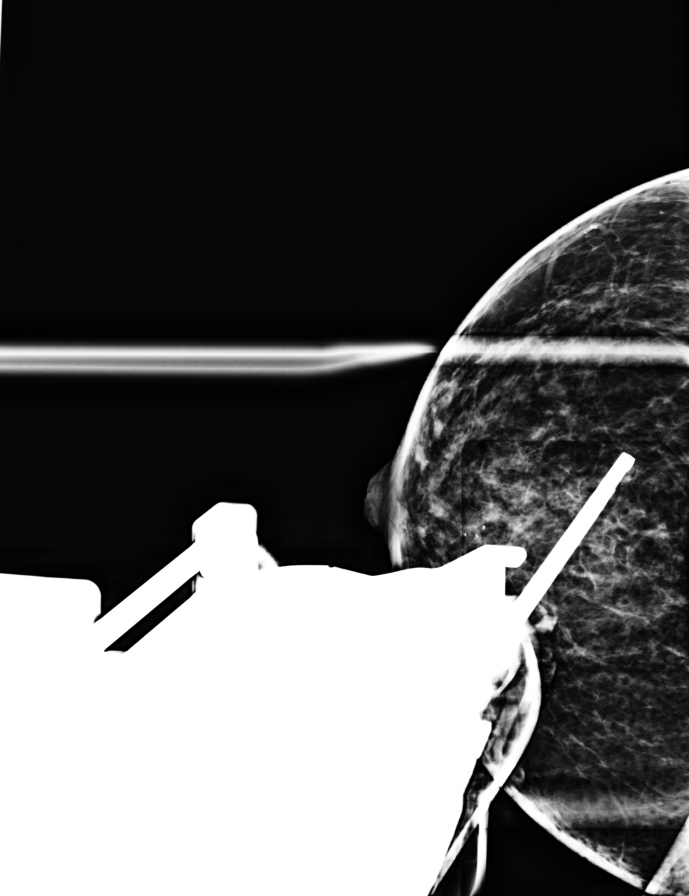

[L FB (4 of 5)]
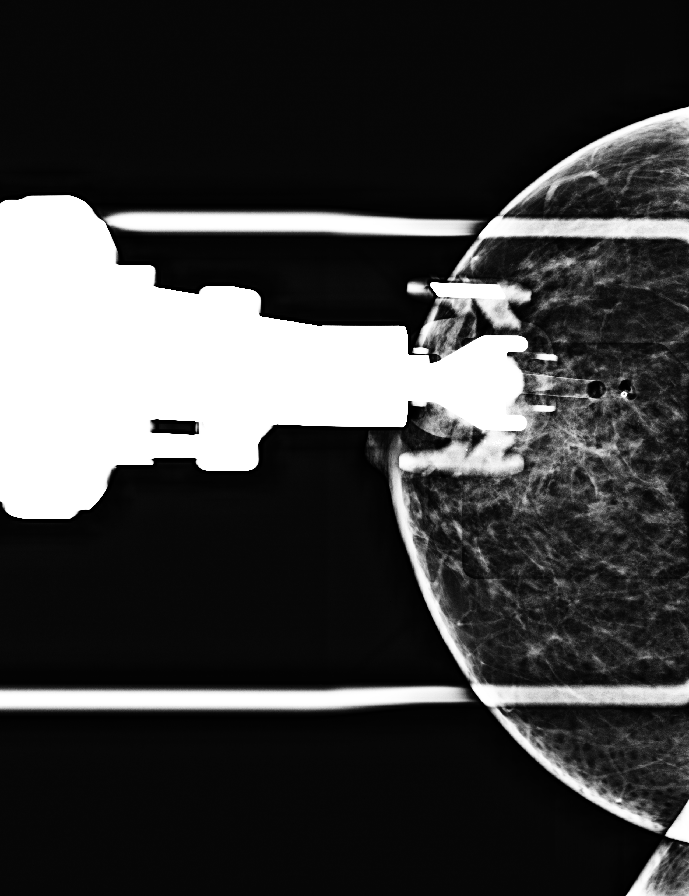

[L FB (5 of 5)]
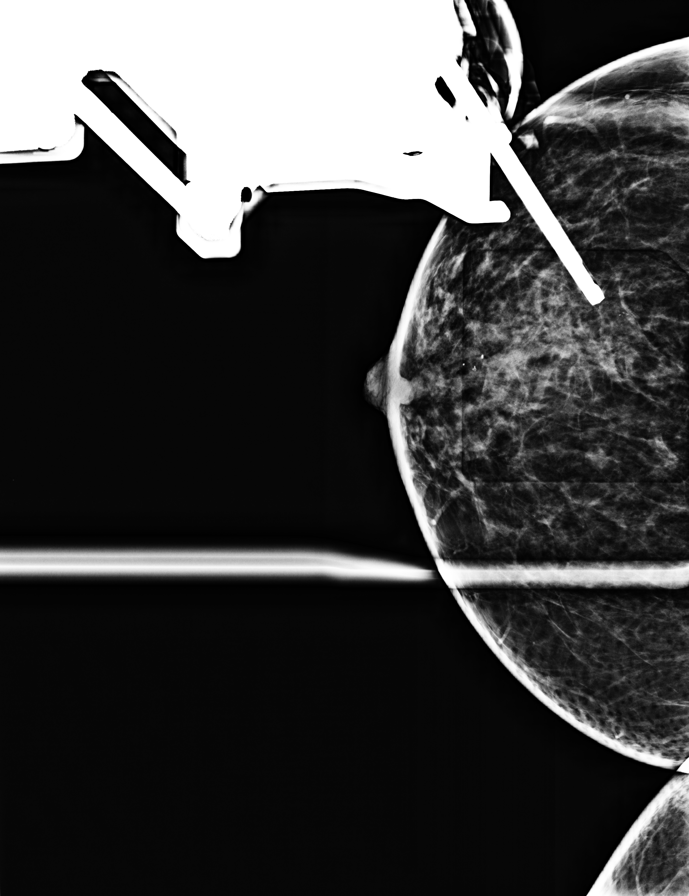

[8 of 18 positions shown; findings below may reference images not displayed]



Using sterile technique with chlorhexidine as skin antisepsis 1%
lidocaine and % lidocaine epinephrine as local anesthetic, under
stereotactic guidance, a 9 gauge Brevera vacuum assisted device was
used to perform core needle biopsy of calcifications involving the
slight LOWER OUTER QUADRANT the LEFT breast at MIDDLE depth using an
inferior approach. Specimen radiograph was performed showing
calcifications in all 3 core samples. Specimens with calcifications
are identified for pathology.

Lesion quadrant: LOWER OUTER QUADRANT.

At the conclusion of the procedure, a coil shaped tissue marker clip
was deployed into the biopsy cavity. Follow-up 2-view mammogram was
performed and dictated separately.
IMPRESSION: Stereotactic-guided biopsy of a new indeterminate 3 mm group of
calcifications involving the LOWER OUTER QUADRANT the LEFT breast at
MIDDLE depth. No apparent complications.

ADDENDUM:
Pathology revealed FIBROCYSTIC CHANGE WITH CALCIFICATIONS of the
Left breast, lower outer quadrant at middle depth. This was found to
be concordant by Dr. Rifatul Islam Putman.

Pathology results were discussed with the patient by telephone. The
patient reported doing well after the biopsy with tenderness at the
site. Post biopsy instructions and care were reviewed and questions
were answered. The patient was encouraged to call The [REDACTED]

The patient was instructed to return for annual screening
mammography and was informed a reminder notice would be sent
regarding this appointment.

Pathology results reported by Yanis Mv, RN on 05/15/2019.



Using sterile technique with chlorhexidine as skin antisepsis 1%
lidocaine and % lidocaine epinephrine as local anesthetic, under
stereotactic guidance, a 9 gauge Brevera vacuum assisted device was
used to perform core needle biopsy of calcifications involving the
slight LOWER OUTER QUADRANT the LEFT breast at MIDDLE depth using an
inferior approach. Specimen radiograph was performed showing
calcifications in all 3 core samples. Specimens with calcifications
are identified for pathology.

Lesion quadrant: LOWER OUTER QUADRANT.

At the conclusion of the procedure, a coil shaped tissue marker clip
was deployed into the biopsy cavity. Follow-up 2-view mammogram was
performed and dictated separately.
IMPRESSION: Stereotactic-guided biopsy of a new indeterminate 3 mm group of
calcifications involving the LOWER OUTER QUADRANT the LEFT breast at
MIDDLE depth. No apparent complications.
